# Patient Record
Sex: Female | Born: 1955 | Race: Black or African American | Hispanic: No | Marital: Single | Smoking: Never smoker
Health system: Southern US, Community
[De-identification: ages and names within clinical notes are randomized; demographics above are authoritative.]

## PROBLEM LIST (undated history)

## (undated) DIAGNOSIS — R51 Headache: Secondary | ICD-10-CM

## (undated) DIAGNOSIS — R21 Rash and other nonspecific skin eruption: Secondary | ICD-10-CM

## (undated) DIAGNOSIS — I10 Essential (primary) hypertension: Secondary | ICD-10-CM

## (undated) HISTORY — PX: HEEL SPUR SURGERY: SHX665

## (undated) HISTORY — PX: COLONOSCOPY: SHX174

## (undated) HISTORY — PX: DISTAL ULNA EXCISION: SUR514

## (undated) HISTORY — PX: WRIST SURGERY: SHX841

## (undated) HISTORY — PX: DILATION AND CURETTAGE OF UTERUS: SHX78

## (undated) HISTORY — PX: ENDOMETRIAL ABLATION: SHX621

---

## 1998-06-10 ENCOUNTER — Other Ambulatory Visit: Admission: RE | Admit: 1998-06-10 | Discharge: 1998-06-10 | Payer: Self-pay | Admitting: Obstetrics & Gynecology

## 1999-08-18 ENCOUNTER — Other Ambulatory Visit: Admission: RE | Admit: 1999-08-18 | Discharge: 1999-08-18 | Payer: Self-pay | Admitting: Obstetrics & Gynecology

## 2000-07-06 ENCOUNTER — Encounter: Admission: RE | Admit: 2000-07-06 | Discharge: 2000-07-06 | Payer: Self-pay | Admitting: Internal Medicine

## 2000-07-06 ENCOUNTER — Encounter: Payer: Self-pay | Admitting: Internal Medicine

## 2001-10-08 ENCOUNTER — Other Ambulatory Visit: Admission: RE | Admit: 2001-10-08 | Discharge: 2001-10-08 | Payer: Self-pay | Admitting: Obstetrics & Gynecology

## 2004-10-28 ENCOUNTER — Other Ambulatory Visit: Admission: RE | Admit: 2004-10-28 | Discharge: 2004-10-28 | Payer: Self-pay | Admitting: Obstetrics & Gynecology

## 2005-01-05 ENCOUNTER — Ambulatory Visit (HOSPITAL_COMMUNITY): Admission: RE | Admit: 2005-01-05 | Discharge: 2005-01-05 | Payer: Self-pay | Admitting: Obstetrics & Gynecology

## 2005-01-05 ENCOUNTER — Encounter (INDEPENDENT_AMBULATORY_CARE_PROVIDER_SITE_OTHER): Payer: Self-pay | Admitting: *Deleted

## 2005-12-28 ENCOUNTER — Other Ambulatory Visit: Admission: RE | Admit: 2005-12-28 | Discharge: 2005-12-28 | Payer: Self-pay | Admitting: Obstetrics & Gynecology

## 2010-09-09 ENCOUNTER — Ambulatory Visit
Admission: RE | Admit: 2010-09-09 | Discharge: 2010-09-09 | Payer: Self-pay | Source: Home / Self Care | Attending: Specialist | Admitting: Specialist

## 2010-09-18 HISTORY — PX: KNEE ARTHROSCOPY: SUR90

## 2010-11-28 LAB — POCT I-STAT, CHEM 8
BUN: 17 mg/dL (ref 6–23)
Calcium, Ion: 1.22 mmol/L (ref 1.12–1.32)
Chloride: 101 mEq/L (ref 96–112)
Creatinine, Ser: 0.9 mg/dL (ref 0.4–1.2)
Glucose, Bld: 119 mg/dL — ABNORMAL HIGH (ref 70–99)
HCT: 41 % (ref 36.0–46.0)
Hemoglobin: 13.9 g/dL (ref 12.0–15.0)
Potassium: 3.4 mEq/L — ABNORMAL LOW (ref 3.5–5.1)
Sodium: 141 mEq/L (ref 135–145)
TCO2: 30 mmol/L (ref 0–100)

## 2011-02-03 NOTE — Op Note (Signed)
Monique Kirby, Monique Kirby             ACCOUNT NO.:  [id number]   MEDICAL RECORD NO.:  [id number]          PATIENT TYPE:  AMB   LOCATION:  SDC                           FACILITY:  [facility]   PHYSICIAN:  Freddy Finner, M.D.   DATE OF BIRTH:  05/02/1956   DATE OF PROCEDURE:  01/05/2005  DATE OF DISCHARGE:                                 OPERATIVE REPORT   PREOPERATIVE DIAGNOSES:  1.  Uterine fibroids.  2.  A 2.6 cm endometrial mass consistent with polyp or fibroid.  3.  Thickened myometrium with degenerating myoma or adenomyosis.  4.  Known pelvic endometriosis.   POSTOPERATIVE DIAGNOSES:  1.  Uterine fibroids.  2.  A 2.6 cm endometrial mass consistent with polyp or fibroid.  3.  Thickened myometrium with degenerating myoma or adenomyosis.  4.  Known pelvic endometriosis.   OPERATIVE PROCEDURE:  Hysteroscopy D&C and NovaSure ablation of endometrial  cavity.   SURGEON:  Freddy Finner, M.D.   ANESTHESIA:  General.   INTRAOPERATIVE COMPLICATIONS:  None.   ESTIMATED INTRAOPERATIVE BLOOD LOSS:  Less than 10 mL.   LACTATED RINGER'S DEFICIT:  60 mL.   The patient is a 55 year old with longstanding known uterine leiomyomata.  She has had menorrhagia with irregular menses. She has been surgically  documented to have pelvic endometriosis. She was seen in February 2006 and a  sonohysterogram revealed a mass in the endometrial cavity. It showed a 4 mm  anterior endometrial lining. It showed thickening of the myometrium and at  least three fibroids, the largest of which was 3.4 cm. She is admitted now  for hysteroscopy D&C.   She was admitted on the morning of surgery. She was brought to the operating  room, there placed under adequate general anesthesia, placed in the dorsal  lithotomy position using the [place] stirrup system. Betadine prep was carried  out in the usual fashion, prepping the mons, perineum, and vagina. Sterile  drapes were applied. A bivalve speculum was introduced. There  was narrowing  of the vaginal apex and stenosis of the cervix which is nulliparous. The  cervix was identified, grasped with a single tooth tenaculum. Cervical  sounding was to 3 cm, uterine sounding was to 10 cm. The cervix was  progressively dilated with Hegar dilators to 23. The 12.5-degree [facility]  hysteroscope was introduced and the endometrial cavity inspected. There was  what appeared to be one small polyp on the lower segment to the patient's  left. In the fundal cavity there was an elongated mass in the posterolateral  right side of the uterine cavity. Photographs were made and retained in the  office record. Gentle, thorough curettage and exploration with the polyp  forceps did produce soft tissue-looking material, perhaps consistent with a  degenerating polyp or myoma. The NovaSure device was then applied in the  standard fashion. The cavity seemed somewhat narrow and somewhat irregular  overall. The application was safely made per the testing. Cavity width,  however, was only approximately 2.4 cm. The device was deployed and  activated. Reinspection after the NovaSure appeared to show minimal  endometrial ablation except  perhaps a little on the lower uterine segment.  It was not felt safe to repeat the procedure and it was not repeated. There  were no significant intraoperative complications of any kind. All the  instruments were removed. The patient was awakened and taken to the recovery  room in good condition. She will be discharged with routine outpatient  surgical instructions. She was given Vicodin to be taken as needed for  postoperative pain.   Power consumed was 86 watts. Time of ablation 1 minute 20 seconds.      WRN/MEDQ  D:  01/05/2005  T:  01/05/2005  Job:  [id number]

## 2011-02-08 ENCOUNTER — Other Ambulatory Visit: Payer: Self-pay | Admitting: Specialist

## 2011-02-08 ENCOUNTER — Other Ambulatory Visit (HOSPITAL_COMMUNITY): Payer: Self-pay | Admitting: Specialist

## 2011-02-08 ENCOUNTER — Ambulatory Visit (HOSPITAL_COMMUNITY)
Admission: RE | Admit: 2011-02-08 | Discharge: 2011-02-08 | Disposition: A | Discharge status: Home / Self Care | Payer: BC Managed Care – PPO | Source: Ambulatory Visit | Attending: Specialist | Admitting: Specialist

## 2011-02-08 ENCOUNTER — Encounter (HOSPITAL_COMMUNITY): Payer: BC Managed Care – PPO

## 2011-02-08 DIAGNOSIS — Z01811 Encounter for preprocedural respiratory examination: Secondary | ICD-10-CM

## 2011-02-08 DIAGNOSIS — Z01812 Encounter for preprocedural laboratory examination: Secondary | ICD-10-CM | POA: Insufficient documentation

## 2011-02-08 DIAGNOSIS — Z01818 Encounter for other preprocedural examination: Secondary | ICD-10-CM | POA: Insufficient documentation

## 2011-02-08 LAB — DIFFERENTIAL
Basophils Absolute: 0 10*3/uL (ref 0.0–0.1)
Basophils Relative: 0 % (ref 0–1)
Eosinophils Absolute: 0.1 10*3/uL (ref 0.0–0.7)
Eosinophils Relative: 1 % (ref 0–5)
Lymphocytes Relative: 43 % (ref 12–46)
Lymphs Abs: 3.2 10*3/uL (ref 0.7–4.0)
Monocytes Absolute: 0.5 10*3/uL (ref 0.1–1.0)
Monocytes Relative: 6 % (ref 3–12)
Neutro Abs: 3.6 10*3/uL (ref 1.7–7.7)
Neutrophils Relative %: 49 % (ref 43–77)

## 2011-02-08 LAB — COMPREHENSIVE METABOLIC PANEL
ALT: 14 U/L (ref 0–35)
AST: 17 U/L (ref 0–37)
Albumin: 4.7 g/dL (ref 3.5–5.2)
Alkaline Phosphatase: 66 U/L (ref 39–117)
BUN: 11 mg/dL (ref 6–23)
CO2: 31 mEq/L (ref 19–32)
Calcium: 10.2 mg/dL (ref 8.4–10.5)
Chloride: 94 mEq/L — ABNORMAL LOW (ref 96–112)
Creatinine, Ser: 0.57 mg/dL (ref 0.4–1.2)
GFR calc Af Amer: >60 mL/min (ref >60)
GFR calc non Af Amer: >60 mL/min (ref >60)
Glucose, Bld: 97 mg/dL (ref 70–99)
Potassium: 3.1 mEq/L — ABNORMAL LOW (ref 3.5–5.1)
Sodium: 136 mEq/L (ref 135–145)
Total Bilirubin: 0.4 mg/dL (ref 0.3–1.2)
Total Protein: 7.9 g/dL (ref 6.0–8.3)

## 2011-02-08 LAB — SURGICAL PCR SCREEN: MRSA, PCR: NEGATIVE

## 2011-02-08 LAB — CBC
HCT: 41.9 % (ref 36.0–46.0)
Hemoglobin: 13.7 g/dL (ref 12.0–15.0)
MCH: 27.9 pg (ref 26.0–34.0)
MCHC: 32.7 g/dL (ref 30.0–36.0)
MCV: 85.3 fL (ref 78.0–100.0)
Platelets: 321 10*3/uL (ref 150–400)
RBC: 4.91 MIL/uL (ref 3.87–5.11)
RDW: 12 % (ref 11.5–15.5)
WBC: 7.3 10*3/uL (ref 4.0–10.5)

## 2011-02-08 LAB — APTT: aPTT: 31 seconds (ref 24–37)

## 2011-02-08 LAB — URINALYSIS, ROUTINE W REFLEX MICROSCOPIC
Bilirubin Urine: NEGATIVE
Nitrite: NEGATIVE
Protein, ur: 30 mg/dL — AB
Specific Gravity, Urine: 1.019 (ref 1.005–1.030)
Urobilinogen, UA: 0.2 mg/dL (ref 0.0–1.0)

## 2011-02-08 LAB — PROTIME-INR
INR: 0.99 (ref 0.00–1.49)
Prothrombin Time: 13.3 seconds (ref 11.6–15.2)

## 2011-02-08 LAB — URINE MICROSCOPIC-ADD ON

## 2011-02-14 ENCOUNTER — Inpatient Hospital Stay (HOSPITAL_COMMUNITY)
Admission: RE | Admit: 2011-02-14 | Discharge: 2011-02-17 | DRG: 209 | Disposition: A | Discharge status: Home Health | Payer: BC Managed Care – PPO | Source: Ambulatory Visit | Attending: Specialist | Admitting: Specialist

## 2011-02-14 DIAGNOSIS — I10 Essential (primary) hypertension: Secondary | ICD-10-CM | POA: Diagnosis not present

## 2011-02-14 DIAGNOSIS — M1710 Unilateral primary osteoarthritis, unspecified knee: Principal | ICD-10-CM | POA: Diagnosis present

## 2011-02-14 DIAGNOSIS — D62 Acute posthemorrhagic anemia: Secondary | ICD-10-CM | POA: Diagnosis not present

## 2011-02-14 DIAGNOSIS — Z01812 Encounter for preprocedural laboratory examination: Secondary | ICD-10-CM

## 2011-02-14 DIAGNOSIS — E871 Hypo-osmolality and hyponatremia: Secondary | ICD-10-CM | POA: Diagnosis not present

## 2011-02-14 HISTORY — PX: JOINT REPLACEMENT: SHX530

## 2011-02-15 LAB — BASIC METABOLIC PANEL
CO2: 26 mEq/L (ref 19–32)
Calcium: 8.8 mg/dL (ref 8.4–10.5)
Creatinine, Ser: 0.6 mg/dL (ref 0.4–1.2)
GFR calc Af Amer: >60 mL/min (ref >60)
GFR calc non Af Amer: >60 mL/min (ref >60)
Sodium: 132 mEq/L — ABNORMAL LOW (ref 135–145)

## 2011-02-15 LAB — CBC
Hemoglobin: 11 g/dL — ABNORMAL LOW (ref 12.0–15.0)
MCH: 28 pg (ref 26.0–34.0)
MCHC: 32.4 g/dL (ref 30.0–36.0)
RDW: 12.5 % (ref 11.5–15.5)

## 2011-02-15 NOTE — Op Note (Signed)
NAMEJEVAEH, Monique Kirby             ACCOUNT NO.:  [id number]  MEDICAL RECORD NO.:  [id number]           PATIENT TYPE:  I  LOCATION:  1609                         FACILITY:  [facility]  PHYSICIAN:  Erasmo Leventhal, M.D.DATE OF BIRTH:  Dec 11, 1955  DATE OF PROCEDURE: DATE OF DISCHARGE:                              OPERATIVE REPORT   PREOPERATIVE DIAGNOSIS:  Right knee end-stage osteoarthritis.  POSTOPERATIVE DIAGNOSIS:  Right knee end-stage osteoarthritis.  PROCEDURE:  Right total knee arthroplasty.  SURGEON:  Erasmo Leventhal, M.D.  ASSISTANT:  Jamelle Rushing, P.A.  ANESTHESIA:  Spinal with monitored anesthesia care.  ESTIMATED BLOOD LOSS:  Loss less than 50 cc.  DRAINS:  One Hemovac.  COMPLICATIONS:  None.  TOURNIQUET TIME:  80 minutes at 250 mmHg.  DISPOSITION:  PACU stable.  OPERATIVE IMPLANTS:  DePuy [employer] Sigma size 2.5 femur, size 2.5 tibia, 10 mm posterior-stabilized rotating platform tibial insert and 35 mm all-polyethylene patella.  All cemented.  OPERATIVE DETAILS:  The patient was counseled in the holding area, the correct site was identified.  Chart was signed appropriately and taken to the operating room,  IV antibiotics were given.  Spinal anesthetic was administered.  Foley catheter was placed utilizing sterile technique by OR circulating nurse.  Right lower extremity was well padded and bumped.  The right knee was examined and she had 5 degrees of flexion contracture.  She can flex to 110 degrees.  She was adequately prepped with DuraPrep in sterile fashion.  She was exsanguinated with Esmarch and tourniquet was inflated to 250 mmHg.  Straight midline incision was made through skin and subcutaneous tissue. Medial soft tissue flaps were developed to appropriate level.  Medial parapatellar arthrotomy was performed.  Approximately, the soft tissue __________ on the tibia.  Knee was then flexed.  End-stage arthritis changes, bone  against bone was soft.  Cruciate ligaments were resected.  Starter hole made in the distal femur.  Canal was irrigated until the effluent was clear. Intramedullary rod was gently placed, size 5 degree valgus cut and 2 mm cut of  distal femur.  Distal femur was found be a size 3.  A 2.5 rotation mark was set, cutting block was 5 cut and it was cut to fit a size 2.5 femur.  Medial and lateral menisci were removed under direct visualization.  Genicular vessels were coagulated.  Posterior neurovascular structures were protected.  Tibia subluxed anteriorly padded.  Utilizing extramedullary alignment, we took a 4 mm cut off patient's side and medial size at 0 degrees slope.  Posteromedial and posterior femoral chamfers were done on direct visualization with a osteotome and mallet.  Flexion and extension blocks did not show well balanced.  Tibia was subluxed anteriorly and found be a size 2.5.  Reamer punch was performed.  Femoral box cut.  Now 2.5 femur, 2.5 tibia, 10 trial insert, excellent range of motion, soft tissue balance, varus-valgus stress.  We chose size 35 and appropriate amount of bone was resected.  Lug holes were made.  Patella tracked anatomically.  All trials removed.  The knee was irrigated with pulsatile lavage utilizing modern cement technique.  All components were cemented into place, 2.5 tibia, 2.5 femur, 10 mm posterior-stabilized rotating platform tibial insert and 35 patella. All cement had cured.  Well-balanced flexor extension gaps, varus-valgus stress.  Trial was removed.  Excess cement was removed.  The knee was irrigated.  Gelfoam was placed posteriorly and tibia subluxed anteriorly and a final 10 mm posterior stabilized rotating platform tibial insert was implanted.  At this point of time, I had a well-aligned and well- balanced tibia.  The Hemovac drain was placed.  The arthrotomy was closed at 90 degrees of flexion with Vicryl suture.  Subcu with Vicryl. Skin  with subcutaneous Monocryl suture.  Steri-Strips applied.  Drains hooked to suction.  The patient received no complications or problems. She was then awakened, and taken to the PACU in stable condition.  To help with surgical technique and decision making, Mr. Oneida Alar, PA assistance was needed throughout the entire case.          ______________________________ Erasmo Leventhal, M.D.     RAC/MEDQ  D:  02/14/2011  T:  02/15/2011  Job:  [id number]  Electronically Signed by Eugenia Mcalpine M.D. on 02/15/2011 01:03:43 PM

## 2011-02-15 NOTE — H&P (Addendum)
Monique Kirby, HAUSCHILD             ACCOUNT NO.:  [id number]  MEDICAL RECORD NO.:  [id number]  LOCATION:                                 FACILITY:  PHYSICIAN:  Erasmo Leventhal, M.D.DATE OF BIRTH:  Jun 13, 1956  DATE OF ADMISSION:  02/14/2011 DATE OF DISCHARGE:                             HISTORY & PHYSICAL   CHIEF COMPLAINT:  Painful right knee.  PRESENT ILLNESS:  The patient is a 55 year old female with chronic issues related to painful right knee.  The patient has failed conservative treatment with injections, arthroscopy, and physical therapy.  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  The patient's history is reviewed and discussed with Dr. Thomasena Edis for further management.  She would like to proceed with a total knee arthroplasty on the right.  ALLERGIES: 1. LEVAQUIN. 2. LODINE. 3. FELDENE. 4. ASPIRIN. 5. NEOMYCIN. 6. NEOSPORIN. 7. She does have a LATEX allergy. 8. She is allergic to [facility] and ZUCCHINI.  PRIMARY CARE PHYSICIAN:  Erskine Speed, M.D.  ALLERGIST:  Percival Spanish, M.D.  CURRENT MEDICATIONS: 1. Diovan/hydrochlorothiazide 160/25 mg a day. 2. Zyrtec 3. ProAir inhaler as needed. 4. Dulera inhaler 2 puffs twice a day.  PAST MEDICAL HISTORY:  Includes: 1. Hypertension. 2. Asthma. 3. History of migraines. 4. Osteoarthritis, right knee. 5. Partial upper dentures.  REVIEW OF SYSTEMS:  NEUROLOGIC:  She does have a history of migraines, mostly been premenopausal but she has been far and few between.  She denies any strokes, seizures, convulsions.  No alcohol or drug problems. PULMONARY:  She does have asthma, it is well controlled on her current medications.  She has never been hospitalized.  Denies any COPD, asbestos, sleep apnea, or tuberculosis.  CARDIOVASCULAR:  Her medicines for her blood pressure have  been stable.  She has not had a stress test. She denied any chest pains, irregular heart rhythms, chest discomfort, shortness of breath.  GI:  She denies any reflux issues, ulcers, gastritis, hepatitis, gallbladder, irritable bowel or colon issue.  GU: She denies any frequent urinary tract infection, cystitis, kidney stones.  ENDOCRINE.  She denies any thyroid or diabetic issues. Hematologic.  She has never had any anemia in and around her periods. She denies any blood clots.  No cancers.  PAST SURGICAL HISTORY:  Includes: 1. Right knee arthroscopy. 2. Benign tumor removal. 3. Right forearm surgery. 4. Right and left wrist surgery without any anesthetic complications.  FAMILY MEDICAL HISTORY:  Father is deceased from scleroderma at age of 17.  Mother is alive at the age of 35 with blood pressure and arthritis.  SOCIAL HISTORY:  She is single.  She does not use alcohol or drugs.  She lives alone in a two-storey home.  PHYSICAL EXAM:  VITAL SIGNS:  Height is 5 feet 4 inches, weight is 138, blood pressure is 138/88, pulse of 80 and regular, respirations are 14 and nonlabored.  She is afebrile. GENERAL:  This is a healthy-appearing well-developed female, conscious, alert, appropriate, appears to be a good historian, appears to be in no distress.  Ambulates with a cane in  her left hand. HEENT:  Head was normocephalic.  Gross hearing is intact. NECK:  Supple.  No palpable lymphadenopathy.  Good range of motion. CHEST:  Lung sounds clear and equal. HEART:  Regular rate and rhythm. ABDOMEN:  Soft, nontender. EXTREMITIES:  Upper extremities had excellent range of motion.  Good motor strength.  Lower extremities, the patient had good range of motion of both hips.  Right knee she had full extension, painful flexion back to 120.  She was sore throughout.  She had no ligament instability. Calves bilaterally were soft and nontender.  No signs of phlebitis or edema.  She had good range of  motion of her left hip and knee without any discomfort. Breast, rectal and GU exams were deferred. Peripheral vascular, carotid pulses were 2+, no bruits.  Radial pulses 2+, posterior tibial pulses were 2+.  IMPRESSION: 1. Advanced osteoarthritic changes, right knee. 2. Hypertension. 3. Asthma. 4. History of migraines.  PLAN:  The patient will undergo all routine labs and tests prior to having a right total knee arthroplasty by Dr. Thomasena Edis at [facility] on Feb 14, 2011.  All the patient's questions were encouraged and answered.     Jamelle Rushing, P.A.   ______________________________ Erasmo Leventhal, M.D.    RWK/MEDQ  D:  02/08/2011  T:  02/08/2011  Job:  [id number]  Electronically Signed by Arlyn Leak P.A. on 03/03/2011 07:31:21 AM Electronically Signed by Eugenia Mcalpine M.D. on 04/05/2011 04:49:56 PM

## 2011-02-16 LAB — BASIC METABOLIC PANEL
CO2: 32 mEq/L (ref 19–32)
Calcium: 9.7 mg/dL (ref 8.4–10.5)
Chloride: 89 mEq/L — ABNORMAL LOW (ref 96–112)
Creatinine, Ser: 0.61 mg/dL (ref 0.4–1.2)
GFR calc Af Amer: >60 mL/min (ref >60)
Sodium: 128 mEq/L — ABNORMAL LOW (ref 135–145)

## 2011-02-16 LAB — CBC
Hemoglobin: 12.3 g/dL (ref 12.0–15.0)
MCH: 28.5 pg (ref 26.0–34.0)
MCHC: 33.5 g/dL (ref 30.0–36.0)
Platelets: 278 10*3/uL (ref 150–400)
RBC: 4.31 MIL/uL (ref 3.87–5.11)

## 2011-02-16 LAB — TSH: TSH: 2.08 u[IU]/mL (ref 0.350–4.500)

## 2011-02-17 LAB — CROSSMATCH
ABO/RH(D): A POS
Antibody Screen: NEGATIVE
Unit division: 0
Unit division: 0

## 2011-02-17 LAB — CBC
MCHC: 33.1 g/dL (ref 30.0–36.0)
Platelets: 301 10*3/uL (ref 150–400)
RDW: 12.5 % (ref 11.5–15.5)
WBC: 8.3 10*3/uL (ref 4.0–10.5)

## 2011-02-17 LAB — BASIC METABOLIC PANEL
Calcium: 9.3 mg/dL (ref 8.4–10.5)
GFR calc Af Amer: >60 mL/min (ref >60)
GFR calc non Af Amer: >60 mL/min (ref >60)
Potassium: 3.9 mEq/L (ref 3.5–5.1)
Sodium: 126 mEq/L — ABNORMAL LOW (ref 135–145)

## 2011-02-17 LAB — OSMOLALITY: Osmolality: 260 mOsm/kg — ABNORMAL LOW (ref 275–300)

## 2011-02-18 LAB — CREATININE, URINE, RANDOM: Creatinine, Urine: 76.9 mg/dL

## 2011-04-05 NOTE — Discharge Summary (Signed)
NAMEJACLYNN, Monique Kirby             ACCOUNT NO.:  [id number]  MEDICAL RECORD NO.:  [id number]  LOCATION:  1609                         FACILITY:  [facility]  PHYSICIAN:  Erasmo Leventhal, M.D.DATE OF BIRTH:  Dec 31, 1955  DATE OF ADMISSION:  02/14/2011 DATE OF DISCHARGE:  02/17/2011                              DISCHARGE SUMMARY   ADMISSION DIAGNOSES: 1. End-stage osteoarthritis right knee. 2. Hypertension. 3. Asthma. 4. Migraines.  DISCHARGE DIAGNOSES: 1. Right total knee arthroplasty without any complications. 2. Postoperative hyponatremia felt related to surgical dehydration and     hydrochlorothiazide. 3. She has uncontrolled postoperative hypertension. 4. History of asthma. 5. History of migraines.  HISTORY OF PRESENT ILLNESS:  The patient is a 55 year old female well known to Dr. Thomasena Kirby for evaluation and treatment of problems related to her right knee.  The patient's evaluation found she had significant arthritis of her right knee.  She failed conservative treatments which included medications and injections.  She has also failed arthroscopic evaluation and debridement.  The patient's pain and difficulty of motion with right knee continued to progress.  The patient elected proceed with a total knee arthroplasty.  PRIMARY CARE PHYSICIAN:  Dr. Nila Kirby.  ALLERGIST:  Dr. Hyde Kirby.  SURGICAL PROCEDURES:  On February 17, 2011, the patient was taken to the OR by Dr. Valma Cava, assisted by Arlyn Leak PA-C, under spinal and MAC anesthesia underwent a right total knee arthroplasty with a DePuy rotating platform Sigma system.  The patient tolerated the procedure well, had no complications.  Minimal blood loss return, no tourniquet used, one Hemovac drain left in place.  The patient was transferred to recovery room and then to the orthopedic floor in good condition to follow a total knee arthroplasty protocol.  The patient had the following components implanted:  A size  2.5 right Sigma femoral component, a size 2.5 keeled tibial tray, a size 2.5 10-mm thickness polyethylene bearing, a size 35 3 peg patella.  All components  were implanted with polymethyl methacrylate.  CONSULTS:  A medical consult was requested for the patient's postoperative hyponatremia natremia, Physical Therapy and, Case Management.  HOSPITAL COURSE:  On Feb 14, 2011, the patient was admitted to [facility] under the care Dr. Valma Cava.  The patient was taken to the OR where a right total knee arthroplasty was performed without any complications.  The patient was transferred to recovery room and then to the orthopedic floor in good condition, followed total knee protocol with IV antibiotics, pain medicines and Lovenox for DVT prophylaxis and one Hemovac drain left in place.  The patient did have some pain issues the first initial postop night, so she was placed on a PCA.  The patient had no other issues.  Vital signs remained stable. First initial postop day, she was a little bit of hypertensive.  Her sodium did drop to 132, potassium 4.3, hemoglobin 11.  The patient was able to wean off her IV medications and her p.o. medications were adjusted and she became more comfortable.  Hemovac drain was discontinued intact.  The patient's blood pressures throughout the day raised from 170s to the 200s, so a medical consult was requested for evaluation and management  of her hypertension and her hyponatremia.  On postop day #2, her hemoglobin was 12.3.  Her sodium was 128 with a potassium of 3.7 but her blood pressure was improving on medications which were adjusted by Medicine.  Her blood pressure was 167/76.  She was placed on restricted IV fluids and restriction of free water.  The hospitalist service still continued to evaluate and manage her medications and her hyponatremia and hypertension.  The patient's dressing was taken down her, her wound was benign for any signs  of infection, her leg was neuromotor vascularly intact.  She was able to participate with physical therapy and use the CPM.  Postop day #3, the patient's blood pressure was doing much better.  She was 122/71.  Her sodium was 126, potassium of 3.9.  The hospitalist service continued to evaluate the patient with evaluating her serum osmolarity, thyroid.  They changed her medications around down to Norvasc and Benicar and she had her hydrochlorothiazide DC'ed the day previous and recommended continue to hold it until she follows up with her primary care physician.  It was felt on postop day #3 from the medical standpoint that her sodium level of 126, she was asymptomatic.  She felt that this would continue to improve as an outpatient as a result of her hydrochlorothiazide and she did not need to be maintained in the hospital but just recommended follow up with her primary care physician.  Her blood pressure was 122/71 on date of discharge.  From the orthopedic standpoint, the patient was medically and orthopedically ready for discharge home.  So she was discharged home in good condition to follow up in 2 weeks with Dr. Thomasena Kirby for her total knee arthroplasty and in 2 weeks with her primary care physician for evaluation of her blood pressure and her sodium level and recommendations of further medication adjustments.  LABORATORY DATA:  CBC on discharge found a white blood cell count at 8.3, hemoglobin 11.9, hematocrit 34.1, platelets of 301.  Estimated GFR was greater than 60.  Urine osmolarity was 223.  TSH was 2.080.  Urine creatinine was 76.9.  Urine sodium was less than 15.  On discharge, her sodium was 126, potassium was 3.9, glucose 126, BUN 14, creatinine 0.86.  DISCHARGE INSTRUCTIONS:  DIET:  No restrictions.  WOUND CARE:  The patient is keep her wound clean and dry.  She is to change her dressing on a daily basis.  ACTIVITY:  She is to increase her activity slowly with the use  of a walker and instructions by physical therapy.  MEDICATIONS: 1. The patient is to hold her hydrocodone while she is using her     postoperative Dilaudid. 2. Dilaudid 2 mg tablets one or two tablets every 4 to 6 hours for     pain if needed. 3. Robaxin 500 mg 1 tablet every 6 hours for muscle spasms if needed. 4. Lovenox 30 mg injection once every 12 hours for a total of 14 days. 5. Tart Cherry OTC tablets 1 or 2 tablets a day. 6. Zyrtec 10 mg a day. 7. Albuterol inhaler 2 puffs a day as needed. 8. Dulera 2 puffs daily. 9. Diovan HCT, on hold. 10.Amlodipine 5 mg with once a day. 11.Benicar 40 mg once a day.  FOLLOWUP:  She has 2-week followup appointment with Dr. Thomasena Kirby for wound check and physical therapy progression.  The patient is call [phone] for that followup appointment.  Follow up with Dr. Nila Kirby in 2 weeks for further  evaluation of her postoperative hypertension, hyponatremia for medication adjustments.  CONDITION ON DISCHARGE:  The patient's condition upon discharge to home is listed as improving good.  To note no for the transcriptionist please go up to her discharge diagnoses she has uncontrolled postoperative hypertension as in the third  DIAGNOSIS:  .  Thank you, please carbon copy Dr. Valma Cava and Dr. Nila Kirby thank you     Jamelle Rushing, P.A.   ______________________________ Erasmo Leventhal, M.D.    RWK/MEDQ  D:  03/01/2011  T:  03/01/2011  Job:  [id number]  cc:   Erasmo Leventhal, M.D. Fax: [phone]  Erskine Speed, M.D. Fax: [phone]  Electronically Signed by Arlyn Leak P.A. on 03/03/2011 07:31:14 AM Electronically Signed by Eugenia Mcalpine M.D. on 04/05/2011 04:49:52 PM

## 2011-04-14 NOTE — Consult Note (Signed)
Monique Kirby, Monique Kirby             ACCOUNT NO.:  [id number]  MEDICAL RECORD NO.:  [id number]           PATIENT TYPE:  I  LOCATION:  1609                         FACILITY:  [facility]  PHYSICIAN:  Baltazar Najjar, MD     DATE OF BIRTH:  10/06/1955  DATE OF CONSULTATION: DATE OF DISCHARGE:                                CONSULTATION   CONSULTING PHYSICIAN:  Dr. Eugenia Mcalpine.  REASON FOR CONSULTATION:  Management of uncontrolled hypertension.  HISTORY OF PRESENT ILLNESS:  Monique Kirby is a 55 year old very pleasant African American woman with history of hypertension, was admitted to the hospital with chief complaint of right knee pain.  She underwent right total knee arthroplasty for right knee end-stage osteoarthritis.  The patient was noted to have uncontrolled hypertension with systolic blood pressure running between 180s to 200s and I was called to see her in consultation for management of her blood pressure.  The patient denies any headache, dizziness, chest pain, or shortness of breath.  She is complaining of significant right knee pain, which she rated 7/10 to 9/10.  Denies any motor weakness, any numbness or other complaints.  No blurring of vision or headache.  PAST MEDICAL HISTORY: 1. End-stage osteoarthritis of the right knee, status post right total     knee arthroplasty. 2. History of hypertension. 3. History of asthma. 4. History of migraines.  PAST SURGICAL HISTORY: 1. Right knee arthroscopy. 2. Right knee arthroplasty. 3. Benign tumor removal. 4. Right forearm surgery. 5. Right and left wrist surgery.  FAMILY HISTORY:  Significant for hypertension in both parent's sides and arthritis.  SOCIAL HISTORY:  She is single, lives alone.  Denies smoking, EtOH drinking, or illicit drug use.  ALLERGIES:  She is allergic to, 1. ASPIRIN. 2. PIROXICAM. 3. LEVAQUIN. 4. ETODOLAC. 5. LATEX.  CURRENT INPATIENT MEDICATIONS: 1. Colace 100 mg b.i.d. 2. Lovenox 30 mg  subcutaneously q.12 h. 3. Ferrous sulfate 325 mg t.i.d. 4. Hydrochlorothiazide 25 mg daily. 5. Dilaudid 7.5 mg IV q.4 h. p.r.n. 6. Benicar 20 mg daily. 7. Senokot 1 tablet p.o. b.i.d. 8. Normal saline 0.9% at 50 cc/hour. 9. Tylenol 325 mg to 650 mg p.o. q.4 h. p.r.n. 10.Albuterol p.r.n. 11.Dulcolax p.r.n. 12.Benadryl p.r.n. 13.Claritin 10 mg daily p.r.n. 14.Maalox 30 mL q.4 h. p.r.n. 15.Robaxin 500 mg p.o. q.6 h. p.r.n. and 500 mg IV q.6 h. p.r.n. 16.Reglan 5 mg to 10 mg q.8 h. p.r.n. 17.Lopressor 5 mg IV q.6 h. p.r.n. 18.Zofran 4 mg IV q.6 h p.r.n. 19.Prochlorperazine 5 mg to 10 mg IV q.6 h. p.r.n. 20.Ambien 5 mg q.h.s. p.r.n.  REVIEW OF SYSTEMS:  CHEST:  No cough, no shortness of breath. CARDIOVASCULAR:  No chest pain or palpitations.  ABDOMEN:  No abdominal pain.  No nausea, no vomiting, no change in bowel habits. CONSTITUTIONAL:  No fever or chills.  NEUROLOGIC:  No headache, no blurring of vision, no dizziness.  No motor weakness or numbness. MUSCULOSKELETAL:  Right knee pain as above.  PHYSICAL EXAMINATION:  VITAL SIGNS:  Systolic blood pressure runs between 180s to 208, heart rate in the 90s, temperature 98.9, and O2 saturation 97% on room air.  GENERAL:  She is alert in moderate distress because of pain. CHEST:  Clear to auscultation bilaterally. CARDIOVASCULAR:  S1 and S2.  Regular rhythm and rate. ABDOMEN:  Obese, soft, and nontender. EXTREMITIES:  Right knee dressing and traction.  Left knee showed no edema.  LABORATORY DATA:  Sodium 132, potassium 4.3, BUN 9, creatinine 0.6, and calcium 8.8.  Hemoglobin 11, hematocrit 34, and platelet count 277.  RADIOLOGY:  Chest x-ray showed no acute chest findings.  ASSESSMENT: 1. Accelerated hypertension. 2. Status post right knee arthroplasty. 3. Right knee pain secondary to #2. 4. Remote history of asthma.  PLAN: 1. Worsening of her blood pressure is most likely secondary to her     pain.  I will recommend better  pain control, which should be     deferred to the orthopedic service.  I recommended to place her on     labetalol 5 mg IV q.6 h. p.r.n. for systolic blood pressure more     than 160 and pulse rate more than 60. 2. I will increase her Benicar to 40 mg daily and I will discontinue     hydrochlorothiazide, given her hyponatremia and start her on     Norvasc 5 mg p.o. daily. 3. Continue to monitor her blood pressure and adjust as needed for     optimal control.  As per the patient, her blood pressure is usually     well controlled at home with systolic range runs usually in the     130s.  I think her significant pain is contributing to worsening     blood pressure. 4. Mild hyponatremia possibly secondary to hydrochlorothiazide, which     I will discontinue. 5. Mild anemia.  The patient is currently on ferrous sulfate and to     continue.  Acute drop probably secondary to blood loss during     surgery. 6. DVT prophylaxis.  The patient is currently on Lovenox.  Thank you for the consult.  We will follow along.          ______________________________ Baltazar Najjar, MD     SA/MEDQ  D:  02/15/2011  T:  02/15/2011  Job:  [id number]  Electronically Signed by Hannah Beat MD on 04/14/2011 03:12:38 PM

## 2011-09-13 ENCOUNTER — Encounter (HOSPITAL_COMMUNITY): Payer: Self-pay

## 2011-09-20 NOTE — H&P (Addendum)
Monique Kirby 09/20/2011 9:12 AM Location: [facility] Patient #: [id number] DOB: [birth year] Single / Language: [place] / Race: Black or African American Female   History of Present Illness(Monique Dierdre Highman, PA-C; 09/20/2011 12:57 PM) The patient is a 56 year old female who comes in today for a preoperative History and Physical. The patient is scheduled for a right L3-4 disectomy (for right L3-4 disc herniation) to be performed by Dr. Debria Garret D. Shon Baton, MD at [facility] on Thursday, September 28, 2011 at 1:00PM .    Family History(Monique J [facility], PA-C; 09/20/2011 10:30 AM) Hypertension. mother and sister Osteoarthritis. mother and sister   Social History(Monique J [facility], PA-C; 09/20/2011 10:30 AM) Alcohol use. never consumed alcohol Children. 0 Current work status. working full time Drug/Alcohol Rehab (Currently). no Drug/Alcohol Rehab (Previously). no Exercise. Exercises weekly; does running / walking and other Illicit drug use. no Living situation. live alone Marital status. single Number of flights of stairs before winded. greater than 5 Pain Contract. no Tobacco / smoke exposure. no Tobacco use. never smoker   Medication History(Monique J KOVACH, PA-C; 09/20/2011 10:33 AM) Diovan HCT (160-25MG  Tablet, 1 Oral daily) Active. Gabapentin (300MG  Capsule, 1 Oral TID) Active. Amitriptyline HCl (25MG  Tablet, 1 Oral qhs) Active. Robaxin (500MG  Tablet, 1 Oral q 6 hours) Active. (patient takes this prn) Dilaudid (2MG  Tablet, 1 Oral 4-6 hours) Active. (patient takes this prn)   Pregnancy / Birth History(Monique J KOVACH, PA-C; 09/20/2011 10:30 AM) Pregnant. no   Past Surgical History(Monique J [facility], PA-C; 09/20/2011 10:30 AM) Arthroscopy of Knee. right Colon Polyp Removal - Colonoscopy Dilation and Curettage of Uterus Foot Surgery. right bilateral Other Orthopaedic Surgery Straighten Nasal Septum Tonsillectomy Total Knee Replacement.  right   Other Problems(Monique J KOVACH, PA-C; 09/20/2011 10:30 AM) Asthma High blood pressure Migraine Headache Osteoarthritis   Review of Systems(Monique J KOVACH, PA-C; 09/20/2011 10:30 AM) General:Present- Night Sweats. Not Present- Chills, Fever, Appetite Loss, Fatigue, Feeling sick, Weight Gain and Weight Loss. Skin:Not Present- Itching, Rash, Skin Color Changes, Ulcer, Psoriasis and Change in Hair or Nails. HEENT:Not Present- Sensitivity to light, Hearing problems, Nose Bleed and Ringing in the Ears. Neck:Not Present- Swollen Glands and Neck Mass. Respiratory:Not Present- Snoring, Chronic Cough, Bloody sputum and Dyspnea. Cardiovascular:Not Present- Shortness of Breath, Chest Pain, Swelling of Extremities, Leg Cramps and Palpitations. Gastrointestinal:Not Present- Bloody Stool, Heartburn, Abdominal Pain, Vomiting, Nausea and Incontinence of Stool. Female Genitourinary:Not Present- Blood in Urine, Menstrual Irregularities, Frequency, Incontinence and Nocturia. Musculoskeletal:Present- Joint Stiffness, Joint Pain and Back Pain. Not Present- Muscle Weakness, Muscle Pain and Joint Swelling. Neurological:Present- Tingling, Numbness and Burning. Not Present- Tremor, Headaches and Dizziness. Psychiatric:Not Present- Anxiety, Depression and Memory Loss. Endocrine:Not Present- Cold Intolerance, Heat Intolerance, Excessive hunger and Excessive Thirst. Hematology:Not Present- Abnormal Bleeding, Anemia, Blood Clots and Easy Bruising.   Vitals(Sharon Gillian Shields; 09/20/2011 10:18 AM) 09/20/2011 10:16 AM Weight: 135.13 lb Height: 64 in Body Surface Area: 1.66 m Body Mass Index: 23.19 kg/m BP: 123/99 (Sitting, Left Arm, Standard)    Physical Exam(Monique J KOVACH, PA-C; 09/20/2011 11:13 AM) The physical exam findings are as follows:  Note: patient reports continued right knee pain since her knee replacement back in May. No true hip or ankle pain on the right. No true hip,  knee or ankle pain on the left.   General General Appearance- pleasant. Not in acute distress. Orientation- Oriented X3. Build & Nutrition- Well nourished and Well developed. Posture- Normal posture. Gait- Normal. Mental Status- Alert.   Integumentary Lumbar Spine-  Skin examination of the lumbar spine is without deformity, skin lesions, lacerations or abrasions.   Head and Neck Neck Global Assessment- supple. no lymphadenopathy and no nucchal rigidty.   Eye Pupil- Bilateral- Normal, Direct reaction to light normal, Equal and Regular. Motion- Bilateral- EOMI.   Chest and Lung Exam Auscultation: Breath sounds:- Clear.   Cardiovascular Auscultation:Rhythm- Regular rate and rhythm. Heart Sounds- Normal heart sounds.   Abdomen Palpation/Percussion:Palpation and Percussion of the abdomen reveal - Non Tender, No Rebound tenderness and Soft.   Peripheral Vascular Lower Extremity:Inspection- Bilateral- Inspection Normal (healed right total knee incision). Palpation:Posterior tibial pulse- Bilateral- 2+. Dorsalis pedis pulse- Bilateral- 2+.   Neurologic Sensation:Lower Extremity- Left- sensation is intact to light touch in the lower extremity. Right- sensation is diminished to light touch in the lower extremity (L4 distribution). Reflexes:Patellar Reflex- Left- 1+. Right- 0. Note: history of right total knee this past May Achilles Reflex- Bilateral- 1+. Babinski- Bilateral- Babinski not present. Clonus- Bilateral- clonus not present. Hoffman's Sign- Bilateral- Hoffman's sign not present. Testing:Seated Straight Leg Raise- Bilateral- Seated straight leg raise negative.   Musculoskeletal Spine/Ribs/Pelvis Lumbosacral Spine:Inspection and Palpation- Tenderness- no tenderness to palpation. bony and soft tissue palpation of the lumbar spine and SI joint does not recreate their typical pain. Strength and Tone: Strength:Hip  Flexion- Bilateral- 5/5. Knee Extension- Bilateral- 5/5. Knee Flexion- Bilateral- 5/5. Ankle Dorsiflexion- Bilateral- 5/5. Ankle Plantarflexion- Bilateral- 5/5. Heel walk- Bilateral- able to heel walk without difficulty. Toe Walk- Bilateral- able to walk on toes without difficulty. Heel-Toe Walk- Bilateral- able to heel-toe walk without difficulty. ROM- Flexion- full range of motion and painful. Extension- full range of motion and painful. Pain:- extension is more painful than flexion. Waddell's Signs- no Waddell's signs present. Gait and Station:Assistive Devices- cane.   Assessment & Plan(Monique J [facility], PA-C; 09/20/2011 10:45 AM) Note: unfortunately conservative measures consisting of observation, activity modification and oral pain medications have failed to alleviate her symptoms and because of the ongoing nature of her pain and the decrease in her quality of life, she wishes to proceed with surgery. Risk/benefits/alternatives to surgery/expectations following surgery have been reviewed with the patient by Dr. Shon Baton.  MRI of the lumbar spine dated 07/14/11 demonstrates L3-4: disc bulge with a superiumposed large right posterolateral disc extrusion which encroaches on the L4 root. Mild right lateral recess stenosis.  She has been medically cleared by her PCP Dr. Nila Nephew. She has not yet completed her pre-op hospital requirements and is scheduled to do so on Monday, September 25, 2011 at 11:00AM. She has not yet been fitted for her lumbar corset brace and I have informed her that our physical therapy department will likely be contacting her to get her fitted.   All of her questions were encouraged, addressed and answered. Plan, at this time, is to proceed with surgery as scheduled.   Signed electronically by Gwinda Maine, PA-C (09/20/2011 12:57 PM)   Marti Sleigh R. Howes 08/23/2011 8:16 AM Location: [facility] Patient #: [id number] DOB: [birth year] Single /  Language: [place] / Race: Black or African American Female   History of Present Illness(Kim A Ambrosio; 08/23/2011 8:22 AM) The patient is a 56 year old female who presents with back pain. The patient reports low back symptoms including pain which began 4 month(s) ago without any known injury. and Symptoms include pain and numbness (rt foot and rt leg). The pain radiates to the left thigh, right buttock, right thigh, right lower leg and right foot. The patient describes the pain  as dull and aching. The patient feels as if the symptoms are worsening. Symptoms are exacerbated by standing, sitting, lifting and bending. Current treatment includes muscle relaxants (robaxin, Dilaudid and Gabapentin). Prior to being seen today the patient was previously evaluated by a colleague (DR Ciro Backer). Past evaluation has included x-ray of the lumbar spine and MRI of the lumbar spine.    Subjective Transcription(Ariza Evans Sheela Stack, MD; 08/29/2011 8:26 AM)  Consultation requested by Dr. Ethelene Hal my partner for evaluation of right leg pain.  Kendrea Million is a very pleasant female who started having significant right leg pain after a total knee replacement in May. She has been in physical therapy postoperatively for the knee and ultimately continued to have a burning pain down the right leg and some numbness. Ultimately she had an MRI done and that demonstrated a L3-4 right disk herniation also with black disk disease and moderate degenerative disk disease at that level. As a result, the patient is referred to me.    Allergies(Kim A Ambrosio; 08/23/2011 8:16 AM) LODINE. 10/21/2009 LEVAQUIN. 10/21/2009 LATEX ALLERGY. 08/11/2003 BAND-AIDS. 08/11/2003 Aspirin. 08/28/1997 Neosporin. 08/28/1997 Feldene. 08/28/1997   Social History(Kim A Ambrosio; 08/23/2011 8:16 AM) Non smoker / no tobacco use Non drinker / no alcohol Use   Medication History(Kim A Ambrosio; 08/23/2011 8:16 AM) Amitriptyline HCl  (25MG  Tablet, 1 Oral po qhs, Taken starting 08/21/2011) Active. (rac/kaa) Amoxicillin (500MG  Tablet, 4 Oral 1-2 hrs prior to dental procedure, Taken starting 08/04/2011) Active. Dilaudid (2MG  Tablet, 1 Oral every six hours, as needed, Taken starting 07/20/2011) Active. (lmw001 (03/08/2011) - rx given to patient kwm) Robaxin (500MG  Tablet, 1 tab po q6hr prn spasms, Taken starting 04/03/2011) Active. Diovan HCT (160-25MG  Tablet, Oral) Active.   Vitals(Kim A Ambrosio; 08/23/2011 8:16 AM) 08/23/2011 8:16 AM Weight: 135.13 lb Height: 64 in Body Surface Area: 1.66 m Body Mass Index: 23.19 kg/m    Objective Transcription(Bernice Mcauliffe D Jkayla Spiewak, MD; 08/29/2011 8:26 AM)  Respiratory, no shortness of breath or chest pain.  Abdomen, soft and nontender.  On examination, she has no focal motor deficits in the lower extremities. She has diminished sensation in the L4 distribution on the right. Reflexes are decreased at the patella on the right but this may also be due to the incision of the total knee replacement. Compartments are soft and nontender. She has a positive femoral stretch test. Negative straight leg raise test.    Assessment & Plan(Kathryn G Johnson; 08/23/2011 8:44 AM) Unspecified Diagnosis Current Plans l Follow up as needed    Assessments Transcription(Daysia Vandenboom D Baileigh Modisette, MD; 08/29/2011 8:26 AM)  At this point in time, we had a long discussion about surgical intervention versus nonoperative treatment. The patient has had no formalized physical therapy for her back. At this point, I agere with Dr. Ethelene Hal. I do not think injection therapy will be very beneficial given the size of the disk herniation. I have discussed the risks of a diskectomy which include infection, bleeding, nerve damage, death, stroke, paralysis, failure to heal and need for further surgery, ongoing or worse pain, recurrent disk herniation, need for fusion surgery in the future, leak of spinal fluid. All of  the patient's questions were addressed. I have given her some literature to review.    Plans Transcription(Novella Abraha Sheela Stack, MD; 08/29/2011 8:26 AM)  We will plan for the patient to review the literature and contact and let me know if she would like to proceed with surgery or with nonoperative treatment. If she elects to go with nonoperative treatment,  I will start formalized physical therapy and see the patient again in January and reevaluate her. If she is improving, then we will continue with the nonoperative treatment. If not, then we will abandon this and proceed with surgery.  With respect to work, I do think that the patient will probably be on ongoing restrictions for at least another four months while we treat the disk herniation. The patient will drop off the necessary paperwork so that we can amend her current impairment disability information from her knee replacement.    Miscellaneous Transcription(Braison Snoke Sheela Stack, MD; 08/29/2011 8:26 AM)  Debria Garret D. Shon Baton, MD/kro  T: 08/25/2011  D: 08/23/2011    Signed electronically by Alvy Beal, MD (08/23/2011 2:15 PM)  Radicular right leg pain L3/4 HNP  No change in clinical exam Plan on discectomy L3/4

## 2011-09-25 ENCOUNTER — Encounter (HOSPITAL_COMMUNITY): Payer: Self-pay

## 2011-09-25 ENCOUNTER — Other Ambulatory Visit: Payer: Self-pay

## 2011-09-25 ENCOUNTER — Encounter (HOSPITAL_COMMUNITY)
Admission: RE | Admit: 2011-09-25 | Discharge: 2011-09-25 | Disposition: A | Discharge status: Home / Self Care | Payer: BC Managed Care – PPO | Source: Ambulatory Visit | Attending: Physician Assistant | Admitting: Physician Assistant

## 2011-09-25 ENCOUNTER — Encounter (HOSPITAL_COMMUNITY)
Admission: RE | Admit: 2011-09-25 | Discharge: 2011-09-25 | Disposition: A | Discharge status: Home / Self Care | Payer: BC Managed Care – PPO | Source: Ambulatory Visit | Attending: Orthopedic Surgery | Admitting: Orthopedic Surgery

## 2011-09-25 HISTORY — DX: Headache: R51

## 2011-09-25 HISTORY — DX: Essential (primary) hypertension: I10

## 2011-09-25 LAB — BASIC METABOLIC PANEL
Calcium: 10.7 mg/dL — ABNORMAL HIGH (ref 8.4–10.5)
GFR calc Af Amer: >90 mL/min (ref >90)
GFR calc non Af Amer: >90 mL/min (ref >90)
Sodium: 140 mEq/L (ref 135–145)

## 2011-09-25 LAB — CBC
MCH: 29 pg (ref 26.0–34.0)
Platelets: 342 10*3/uL (ref 150–400)
RBC: 4.73 MIL/uL (ref 3.87–5.11)
WBC: 7.6 10*3/uL (ref 4.0–10.5)

## 2011-09-25 LAB — SURGICAL PCR SCREEN
MRSA, PCR: NEGATIVE
Staphylococcus aureus: NEGATIVE

## 2011-09-25 NOTE — Pre-Procedure Instructions (Signed)
20 Monique Kirby  09/25/2011   Your procedure is scheduled on:  Sep 28, 2011  Report to [facility] at 1100 AM.  Call this number if you have problems the morning of surgery: [phone]   Remember:   Do not eat food:After Midnight.  May have clear liquids: up to 4 Hours before arrival.  Clear liquids include soda, tea, black coffee, apple or grape juice, broth.  Take these medicines the morning of surgery with A SIP OF WATER: inhaler, pain pill   Do not wear jewelry, make-up or nail polish.  Do not wear lotions, powders, or perfumes. You may wear deodorant.  Do not shave 48 hours prior to surgery.  Do not bring valuables to the hospital.  Contacts, dentures or bridgework may not be worn into surgery.  Leave suitcase in the car. After surgery it may be brought to your room.  For patients admitted to the hospital, checkout time is 11:00 AM the day of discharge.   Patients discharged the day of surgery will not be allowed to drive home.  Name and phone number of your driver: NA  Special Instructions: Incentive Spirometry - Practice and bring it with you on the day of surgery. and CHG Shower Use Special Wash: 1/2 bottle night before surgery and 1/2 bottle morning of surgery.   Please read over the following fact sheets that you were given: Pain Booklet and Surgical Site Infection Prevention

## 2011-09-27 MED ORDER — CEFAZOLIN SODIUM-DEXTROSE 2-3 GM-% IV SOLR
2.0000 g | INTRAVENOUS | Status: AC
  Administered 2011-09-28: 2 g via INTRAVENOUS
  Filled 2011-09-27: qty 50

## 2011-09-27 MED ORDER — LACTATED RINGERS IV SOLN
INTRAVENOUS | Status: DC
  Administered 2011-09-28: 12:00:00 via INTRAVENOUS

## 2011-09-28 ENCOUNTER — Encounter (HOSPITAL_COMMUNITY): Payer: Self-pay | Admitting: Anesthesiology

## 2011-09-28 ENCOUNTER — Encounter (HOSPITAL_COMMUNITY)
Admission: RE | Disposition: A | Discharge status: Home / Self Care | Payer: Self-pay | Source: Ambulatory Visit | Attending: Orthopedic Surgery

## 2011-09-28 ENCOUNTER — Ambulatory Visit (HOSPITAL_COMMUNITY): Payer: BC Managed Care – PPO

## 2011-09-28 ENCOUNTER — Ambulatory Visit (HOSPITAL_COMMUNITY): Payer: BC Managed Care – PPO | Admitting: Anesthesiology

## 2011-09-28 ENCOUNTER — Encounter (HOSPITAL_COMMUNITY): Payer: Self-pay | Admitting: *Deleted

## 2011-09-28 ENCOUNTER — Ambulatory Visit (HOSPITAL_COMMUNITY)
Admission: RE | Admit: 2011-09-28 | Discharge: 2011-09-29 | Disposition: A | Discharge status: Home / Self Care | Payer: BC Managed Care – PPO | Source: Ambulatory Visit | Attending: Orthopedic Surgery | Admitting: Orthopedic Surgery

## 2011-09-28 DIAGNOSIS — Z01811 Encounter for preprocedural respiratory examination: Secondary | ICD-10-CM | POA: Insufficient documentation

## 2011-09-28 DIAGNOSIS — Z01810 Encounter for preprocedural cardiovascular examination: Secondary | ICD-10-CM | POA: Insufficient documentation

## 2011-09-28 DIAGNOSIS — M5126 Other intervertebral disc displacement, lumbar region: Secondary | ICD-10-CM | POA: Insufficient documentation

## 2011-09-28 DIAGNOSIS — Z01812 Encounter for preprocedural laboratory examination: Secondary | ICD-10-CM | POA: Insufficient documentation

## 2011-09-28 DIAGNOSIS — J45909 Unspecified asthma, uncomplicated: Secondary | ICD-10-CM | POA: Insufficient documentation

## 2011-09-28 DIAGNOSIS — I10 Essential (primary) hypertension: Secondary | ICD-10-CM | POA: Insufficient documentation

## 2011-09-28 DIAGNOSIS — Z01818 Encounter for other preprocedural examination: Secondary | ICD-10-CM | POA: Insufficient documentation

## 2011-09-28 HISTORY — DX: Rash and other nonspecific skin eruption: R21

## 2011-09-28 HISTORY — PX: LUMBAR LAMINECTOMY/DECOMPRESSION MICRODISCECTOMY: SHX5026

## 2011-09-28 LAB — GLUCOSE, CAPILLARY: Glucose-Capillary: 146 mg/dL — ABNORMAL HIGH (ref 70–99)

## 2011-09-28 SURGERY — LUMBAR LAMINECTOMY/DECOMPRESSION MICRODISCECTOMY
Anesthesia: General | Site: Back | Laterality: Right | Wound class: Clean

## 2011-09-28 MED ORDER — BUPIVACAINE-EPINEPHRINE 0.25% -1:200000 IJ SOLN
INTRAMUSCULAR | Status: DC | PRN
  Administered 2011-09-28: 10 mL

## 2011-09-28 MED ORDER — GABAPENTIN 300 MG PO CAPS
300.0000 mg | ORAL_CAPSULE | Freq: Three times a day (TID) | ORAL | Status: DC
  Administered 2011-09-28 – 2011-09-29 (×2): 300 mg via ORAL
  Filled 2011-09-28 (×4): qty 1

## 2011-09-28 MED ORDER — ACETAMINOPHEN 10 MG/ML IV SOLN
1000.0000 mg | Freq: Four times a day (QID) | INTRAVENOUS | Status: DC
  Administered 2011-09-28 – 2011-09-29 (×3): 1000 mg via INTRAVENOUS
  Filled 2011-09-28 (×4): qty 100

## 2011-09-28 MED ORDER — OXYCODONE HCL 5 MG PO TABS
5.0000 mg | ORAL_TABLET | ORAL | Status: DC | PRN
  Administered 2011-09-28 – 2011-09-29 (×4): 10 mg via ORAL
  Filled 2011-09-28 (×4): qty 2

## 2011-09-28 MED ORDER — MOMETASONE FURO-FORMOTEROL FUM 100-5 MCG/ACT IN AERO: 2.0000 | INHALATION_SPRAY | Freq: Every day | RESPIRATORY_TRACT | Status: DC | PRN

## 2011-09-28 MED ORDER — ACETAMINOPHEN 10 MG/ML IV SOLN
INTRAVENOUS | Status: AC
  Filled 2011-09-28: qty 100

## 2011-09-28 MED ORDER — THROMBIN 20000 UNITS EX KIT
PACK | CUTANEOUS | Status: DC | PRN
  Administered 2011-09-28: 14:00:00 via TOPICAL

## 2011-09-28 MED ORDER — MORPHINE SULFATE 2 MG/ML IJ SOLN
INTRAMUSCULAR | Status: AC
  Filled 2011-09-28: qty 1

## 2011-09-28 MED ORDER — DIPHENHYDRAMINE HCL 50 MG/ML IJ SOLN
INTRAMUSCULAR | Status: DC | PRN
  Administered 2011-09-28: 25 mg via INTRAVENOUS

## 2011-09-28 MED ORDER — METHOCARBAMOL 100 MG/ML IJ SOLN
500.0000 mg | Freq: Four times a day (QID) | INTRAVENOUS | Status: DC | PRN
  Filled 2011-09-28: qty 5

## 2011-09-28 MED ORDER — HYDROCHLOROTHIAZIDE 25 MG PO TABS
25.0000 mg | ORAL_TABLET | Freq: Every day | ORAL | Status: DC
  Administered 2011-09-28 – 2011-09-29 (×2): 25 mg via ORAL
  Filled 2011-09-28 (×2): qty 1

## 2011-09-28 MED ORDER — ZOLPIDEM TARTRATE 10 MG PO TABS: 10.0000 mg | ORAL_TABLET | Freq: Every evening | ORAL | Status: DC | PRN

## 2011-09-28 MED ORDER — HYDROMORPHONE HCL PF 1 MG/ML IJ SOLN
0.2500 mg | INTRAMUSCULAR | Status: DC | PRN
  Administered 2011-09-28 (×2): 0.5 mg via INTRAVENOUS

## 2011-09-28 MED ORDER — ACETAMINOPHEN 10 MG/ML IV SOLN
1000.0000 mg | Freq: Once | INTRAVENOUS | Status: AC
  Administered 2011-09-28: 1000 mg via INTRAVENOUS
  Filled 2011-09-28: qty 100

## 2011-09-28 MED ORDER — LACTATED RINGERS IV SOLN: INTRAVENOUS | Status: DC

## 2011-09-28 MED ORDER — PROMETHAZINE HCL 25 MG/ML IJ SOLN: 6.2500 mg | INTRAMUSCULAR | Status: DC | PRN

## 2011-09-28 MED ORDER — SODIUM CHLORIDE 0.9 % IJ SOLN: 3.0000 mL | INTRAMUSCULAR | Status: DC | PRN

## 2011-09-28 MED ORDER — FENTANYL CITRATE 0.05 MG/ML IJ SOLN
INTRAMUSCULAR | Status: DC | PRN
  Administered 2011-09-28: 150 ug via INTRAVENOUS
  Administered 2011-09-28: 100 ug via INTRAVENOUS

## 2011-09-28 MED ORDER — DEXAMETHASONE SODIUM PHOSPHATE 4 MG/ML IJ SOLN
4.0000 mg | Freq: Four times a day (QID) | INTRAMUSCULAR | Status: DC
  Administered 2011-09-28: 4 mg via INTRAVENOUS
  Filled 2011-09-28 (×7): qty 1

## 2011-09-28 MED ORDER — MORPHINE SULFATE 2 MG/ML IJ SOLN: 0.0500 mg/kg | INTRAMUSCULAR | Status: DC | PRN

## 2011-09-28 MED ORDER — VECURONIUM BROMIDE 10 MG IV SOLR
INTRAVENOUS | Status: DC | PRN
  Administered 2011-09-28: 8 mg via INTRAVENOUS

## 2011-09-28 MED ORDER — HEMOSTATIC AGENTS (NO CHARGE) OPTIME
TOPICAL | Status: DC | PRN
  Administered 2011-09-28: 1 via TOPICAL

## 2011-09-28 MED ORDER — CEFAZOLIN SODIUM 1-5 GM-% IV SOLN
1.0000 g | Freq: Three times a day (TID) | INTRAVENOUS | Status: AC
  Administered 2011-09-28 – 2011-09-29 (×2): 1 g via INTRAVENOUS
  Filled 2011-09-28 (×2): qty 50

## 2011-09-28 MED ORDER — PROPOFOL 10 MG/ML IV EMUL
INTRAVENOUS | Status: DC | PRN
  Administered 2011-09-28: 200 mg via INTRAVENOUS

## 2011-09-28 MED ORDER — ONDANSETRON HCL 4 MG/2ML IJ SOLN: 4.0000 mg | INTRAMUSCULAR | Status: DC | PRN

## 2011-09-28 MED ORDER — ALBUTEROL SULFATE HFA 108 (90 BASE) MCG/ACT IN AERS
2.0000 | INHALATION_SPRAY | Freq: Four times a day (QID) | RESPIRATORY_TRACT | Status: DC | PRN
  Filled 2011-09-28: qty 6.7

## 2011-09-28 MED ORDER — MIDAZOLAM HCL 5 MG/5ML IJ SOLN
INTRAMUSCULAR | Status: DC | PRN
  Administered 2011-09-28: 2 mg via INTRAVENOUS

## 2011-09-28 MED ORDER — SODIUM CHLORIDE 0.9 % IJ SOLN: 3.0000 mL | Freq: Two times a day (BID) | INTRAMUSCULAR | Status: DC

## 2011-09-28 MED ORDER — METHOCARBAMOL 500 MG PO TABS
500.0000 mg | ORAL_TABLET | Freq: Four times a day (QID) | ORAL | Status: DC | PRN
  Administered 2011-09-29 (×2): 500 mg via ORAL
  Filled 2011-09-28 (×2): qty 1

## 2011-09-28 MED ORDER — GLYCOPYRROLATE 0.2 MG/ML IJ SOLN
INTRAMUSCULAR | Status: DC | PRN
  Administered 2011-09-28: .4 mg via INTRAVENOUS

## 2011-09-28 MED ORDER — LACTATED RINGERS IV SOLN
INTRAVENOUS | Status: DC | PRN
  Administered 2011-09-28 (×2): via INTRAVENOUS

## 2011-09-28 MED ORDER — DEXAMETHASONE SODIUM PHOSPHATE 10 MG/ML IJ SOLN
10.0000 mg | Freq: Once | INTRAMUSCULAR | Status: AC
  Administered 2011-09-28: 10 mg via INTRAVENOUS
  Filled 2011-09-28: qty 1

## 2011-09-28 MED ORDER — 0.9 % SODIUM CHLORIDE (POUR BTL) OPTIME
TOPICAL | Status: DC | PRN
  Administered 2011-09-28: 1000 mL

## 2011-09-28 MED ORDER — VALSARTAN-HYDROCHLOROTHIAZIDE 160-25 MG PO TABS: 1.0000 | ORAL_TABLET | Freq: Every day | ORAL | Status: DC

## 2011-09-28 MED ORDER — ONDANSETRON HCL 4 MG/2ML IJ SOLN
INTRAMUSCULAR | Status: DC | PRN
  Administered 2011-09-28: 4 mg via INTRAVENOUS

## 2011-09-28 MED ORDER — NEOSTIGMINE METHYLSULFATE 1 MG/ML IJ SOLN
INTRAMUSCULAR | Status: DC | PRN
  Administered 2011-09-28: 3 mg via INTRAVENOUS

## 2011-09-28 MED ORDER — HYDROMORPHONE HCL 2 MG PO TABS: 2.0000 mg | ORAL_TABLET | Freq: Four times a day (QID) | ORAL | Status: DC | PRN

## 2011-09-28 MED ORDER — OLMESARTAN MEDOXOMIL 20 MG PO TABS
20.0000 mg | ORAL_TABLET | Freq: Every day | ORAL | Status: DC
  Administered 2011-09-28 – 2011-09-29 (×2): 20 mg via ORAL
  Filled 2011-09-28 (×2): qty 1

## 2011-09-28 MED ORDER — METHOCARBAMOL 100 MG/ML IJ SOLN
500.0000 mg | Freq: Once | INTRAVENOUS | Status: AC
  Administered 2011-09-28: 500 mg via INTRAVENOUS
  Filled 2011-09-28: qty 5

## 2011-09-28 MED ORDER — MEPERIDINE HCL 25 MG/ML IJ SOLN: 6.2500 mg | INTRAMUSCULAR | Status: DC | PRN

## 2011-09-28 MED ORDER — MORPHINE SULFATE 2 MG/ML IJ SOLN
1.0000 mg | INTRAMUSCULAR | Status: DC | PRN
  Administered 2011-09-28 – 2011-09-29 (×2): 2 mg via INTRAVENOUS
  Filled 2011-09-28 (×2): qty 1

## 2011-09-28 MED ORDER — SODIUM CHLORIDE 0.9 % IV SOLN: 250.0000 mL | INTRAVENOUS | Status: DC

## 2011-09-28 MED ORDER — DEXAMETHASONE 4 MG PO TABS
4.0000 mg | ORAL_TABLET | Freq: Four times a day (QID) | ORAL | Status: DC
  Administered 2011-09-28 – 2011-09-29 (×3): 4 mg via ORAL
  Filled 2011-09-28 (×7): qty 1

## 2011-09-28 SURGICAL SUPPLY — 58 items
ADH SKN CLS APL DERMABOND .7 (GAUZE/BANDAGES/DRESSINGS)
BUR EGG ELITE 4.0 (BURR) IMPLANT
BUR MATCHSTICK NEURO 3.0 LAGG (BURR) ×1 IMPLANT
CANISTER SUCTION 2500CC (MISCELLANEOUS) ×2 IMPLANT
CLOTH BEACON ORANGE TIMEOUT ST (SAFETY) ×2 IMPLANT
CORDS BIPOLAR (ELECTRODE) ×2 IMPLANT
COVER SURGICAL LIGHT HANDLE (MISCELLANEOUS) ×2 IMPLANT
DERMABOND ADVANCED (GAUZE/BANDAGES/DRESSINGS)
DERMABOND ADVANCED .7 DNX12 (GAUZE/BANDAGES/DRESSINGS) ×1 IMPLANT
DRAIN CHANNEL 15F RND FF W/TCR (WOUND CARE) ×2 IMPLANT
DRAPE POUCH INSTRU U-SHP 10X18 (DRAPES) ×2 IMPLANT
DRAPE SURG 17X23 STRL (DRAPES) ×1 IMPLANT
DRAPE U-SHAPE 47X51 STRL (DRAPES) ×2 IMPLANT
DRSG MEPILEX BORDER 4X8 (GAUZE/BANDAGES/DRESSINGS) ×2 IMPLANT
DURAPREP 26ML APPLICATOR (WOUND CARE) ×2 IMPLANT
ELECT BLADE 4.0 EZ CLEAN MEGAD (MISCELLANEOUS) ×2
ELECT CAUTERY BLADE 6.4 (BLADE) ×2 IMPLANT
ELECT REM PT RETURN 9FT ADLT (ELECTROSURGICAL) ×2
ELECTRODE BLDE 4.0 EZ CLN MEGD (MISCELLANEOUS) IMPLANT
ELECTRODE REM PT RTRN 9FT ADLT (ELECTROSURGICAL) ×1 IMPLANT
EVACUATOR 1/8 PVC DRAIN (DRAIN) IMPLANT
EVACUATOR SILICONE 100CC (DRAIN) ×2 IMPLANT
GLOVE BIOGEL PI IND STRL 6.5 (GLOVE) ×1 IMPLANT
GLOVE BIOGEL PI IND STRL 8.5 (GLOVE) ×1 IMPLANT
GLOVE BIOGEL PI INDICATOR 6.5 (GLOVE) ×1
GLOVE BIOGEL PI INDICATOR 8.5 (GLOVE) ×1
GLOVE ECLIPSE 6.0 STRL STRAW (GLOVE) ×2 IMPLANT
GLOVE ECLIPSE 8.5 STRL (GLOVE) ×2 IMPLANT
GOWN PREVENTION PLUS XXLARGE (GOWN DISPOSABLE) ×2 IMPLANT
GOWN STRL NON-REIN LRG LVL3 (GOWN DISPOSABLE) ×4 IMPLANT
KIT BASIN OR (CUSTOM PROCEDURE TRAY) ×2 IMPLANT
KIT ROOM TURNOVER OR (KITS) ×2 IMPLANT
NDL SPNL 18GX3.5 QUINCKE PK (NEEDLE) ×2 IMPLANT
NEEDLE 22X1 1/2 (OR ONLY) (NEEDLE) ×2 IMPLANT
NEEDLE SPNL 18GX3.5 QUINCKE PK (NEEDLE) ×4 IMPLANT
NS IRRIG 1000ML POUR BTL (IV SOLUTION) ×2 IMPLANT
PACK LAMINECTOMY ORTHO (CUSTOM PROCEDURE TRAY) ×2 IMPLANT
PACK UNIVERSAL I (CUSTOM PROCEDURE TRAY) ×2 IMPLANT
PAD ARMBOARD 7.5X6 YLW CONV (MISCELLANEOUS) ×4 IMPLANT
PATTIES SURGICAL .5 X.5 (GAUZE/BANDAGES/DRESSINGS) IMPLANT
PATTIES SURGICAL .5 X1 (DISPOSABLE) ×1 IMPLANT
SPONGE SURGIFOAM ABS GEL 100 (HEMOSTASIS) ×1 IMPLANT
STRIP CLOSURE SKIN 1/2X4 (GAUZE/BANDAGES/DRESSINGS) ×2 IMPLANT
SURGIFLO TRUKIT (HEMOSTASIS) ×1 IMPLANT
SUT MNCRL AB 3-0 PS2 18 (SUTURE) ×2 IMPLANT
SUT VIC AB 0 CT1 27 (SUTURE)
SUT VIC AB 0 CT1 27XBRD ANBCTR (SUTURE) ×1 IMPLANT
SUT VIC AB 1 CT1 27 (SUTURE) ×2
SUT VIC AB 1 CT1 27XBRD ANBCTR (SUTURE) IMPLANT
SUT VIC AB 1 CTX 36 (SUTURE)
SUT VIC AB 1 CTX36XBRD ANBCTR (SUTURE) ×2 IMPLANT
SUT VIC AB 2-0 CT1 18 (SUTURE) ×2 IMPLANT
SYR BULB IRRIGATION 50ML (SYRINGE) ×2 IMPLANT
SYR CONTROL 10ML LL (SYRINGE) ×2 IMPLANT
TOWEL OR 17X24 6PK STRL BLUE (TOWEL DISPOSABLE) ×2 IMPLANT
TOWEL OR 17X26 10 PK STRL BLUE (TOWEL DISPOSABLE) ×2 IMPLANT
WATER STERILE IRR 1000ML POUR (IV SOLUTION) ×2 IMPLANT
YANKAUER SUCT BULB TIP NO VENT (SUCTIONS) ×2 IMPLANT

## 2011-09-28 NOTE — Addendum Note (Signed)
Addendum  created 09/28/11 1554 by Judie Petit, MD   Modules edited:Orders, PRL Based Order Sets

## 2011-09-28 NOTE — Op Note (Signed)
OPERATIVE REPORT  DATE OF SURGERY: 09/28/2011  PATIENT NAME:  Monique Kirby MRN: [id number] DOB: June 25, 1956  PCP: Enrique Sack, MD, MD  PRE-OPERATIVE DIAGNOSIS:  L3-4 herniation right side  POST-OPERATIVE DIAGNOSIS:  Same  PROCEDURE:   Right L3-4 discectomy Intraoperative findings posterior lateral to the right disc herniation removed without complication Was asleep with a dural elevator circumferentially and along the L4 nerve root towards the L4 foramen was no further neurotension  SURGEON:  Venita Lick, MD  PHYSICIAN ASSISTANT: None   ANESTHESIA:   General  EBL: 20 ml   BRIEF HISTORY: Monique Kirby is a 56 y.o. female who since to my office with complaints of significant back buttock and right leg pain. Patient's clinical exam was consistent with an L4 right radiculopathy. An MRI confirmed the diagnosis of the posterior lateral the right disc herniation at L3-4. After discussing treatment options she elected to proceed with surgical intervention. All appropriate risks benefits and alternatives were discussed with the patient and consent was obtained.  PROCEDURE DETAILS: Patient was brought into the operating room. After successful induction of general anesthesia and tracheal intubation a Time Out was done. This confirmed all pertinent important data. She was turned prone onto the Wilson frame all bony prominences well padded the back was prepped and draped in a standard fashion. Ted's and SCDs were also applied.  2 needles were placed into the back Mark at the incision and x-ray was taken to identify the L3-4 disc space midline incision was mapped out and was infiltrated with quarter percent Marcaine. Sharp dissection was carried out down to the deep fascia. The fascia sharply incised and strip the paraspinal muscles to expose the L3 and L4 spinous process and lamina. I now good visualization the L3-4 interspace. I placed a Penfield 4 underneath the L3 lamina and took a  second x-ray to confirm that I was at the appropriate level. Once I confirmed this used I nerve hook to the release the ligamentum flavum from the undersurface and any laminotomy of 3 was performed in a careful I then released the ligamentum flavum from the into the L4 lamina using a curette to this allowed me to elevate resect the ligamentum flavum to expose the underlying thecal sac. I then dissected into the lateral gutter performing a very partial facetectomy. At this point time I could now mobilize the nerve root and thecal sac and expose the posterior lateral disc herniation on proceeding disc herniation clearly I then retracted the thecal sac incise the annulus and using a combination of pituitary rongeurs and nerve roots I removed all the disc material.  I then used bipolar artery to obtain hemostasis. I then used a ball-tip nerve hook and felt circumferentially at the level of the disc space superiorly medially inferiorly and laterally. There was no further fragmented disc and the nerve root itself was no longer under tension and dural elevator in length along the lateral recess towards the L4 foramen. This allowed me to traversing L4 nerve root and ensure that it was free from tension. I then took the neural elements are circumferentially disc space and again to further was no further fragments of disc material.  At this point time satisfied that I had complete discectomy and began irrigating copiously with saline I was able to repair the amount of disc material that remove waxing a preop on her preoperative MRI and I was satisfied that there were similar in amount. Her graft I then irrigated copiously normal  saline placed thrombin-soaked Gelfoam padding over the exposed lamina and then closed the deep fascia with interrupted #1 Vicryl sutures. I then closed superficial 2- 0 Monocryl for the skin. Steri-Strips dry dressing were applied. At the end of the case all needle and sponge counts were correct   and there was no adverse intraoperative event.  Venita Lick, MD 09/28/2011 2:39 PM

## 2011-09-28 NOTE — Addendum Note (Signed)
Addendum  created 09/28/11 1553 by Judie Petit, MD   Modules edited:Orders

## 2011-09-28 NOTE — Preoperative (Signed)
Beta Blockers   Reason not to administer Beta Blockers:Not Applicable 

## 2011-09-28 NOTE — Transfer of Care (Signed)
Immediate Anesthesia Transfer of Care Note  Patient: Monique Kirby  Procedure(s) Performed:  LUMBAR LAMINECTOMY/DECOMPRESSION MICRODISCECTOMY - RIGHT L3-4 DISCECTOMY  Patient Location: PACU  Anesthesia Type: General  Level of Consciousness: awake, alert  and oriented  Airway & Oxygen Therapy: Patient Spontanous Breathing and Patient connected to nasal cannula oxygen  Post-op Assessment: Report given to PACU RN, Post -op Vital signs reviewed and stable, Patient moving all extremities and Patient able to stick tongue midline  Post vital signs: Reviewed and stable Filed Vitals:   09/28/11 1505  BP:   Pulse:   Temp: 37 C  Resp:     Complications: No apparent anesthesia complications

## 2011-09-28 NOTE — Anesthesia Postprocedure Evaluation (Signed)
  Anesthesia Post-op Note  Patient: Monique Kirby  Procedure(s) Performed:  LUMBAR LAMINECTOMY/DECOMPRESSION MICRODISCECTOMY - RIGHT L3-4 DISCECTOMY  Patient Location: PACU  Anesthesia Type: General  Level of Consciousness: awake  Airway and Oxygen Therapy: Patient Spontanous Breathing  Post-op Pain: mild  Post-op Assessment: Post-op Vital signs reviewed  Post-op Vital Signs: stable  Complications: No apparent anesthesia complications

## 2011-09-28 NOTE — Anesthesia Preprocedure Evaluation (Addendum)
Anesthesia Evaluation  Patient identified by MRN, date of birth, ID band Patient awake    Reviewed: Allergy & Precautions, H&P , NPO status , Patient's Chart, lab work & pertinent test results, reviewed documented beta blocker date and time   Airway Mallampati: II      Dental  (+) Upper Dentures   Pulmonary asthma ,  clear to auscultation        Cardiovascular hypertension, Pt. on medications Regular Normal    Neuro/Psych  Headaches,    GI/Hepatic negative GI ROS, Neg liver ROS,   Endo/Other  Negative Endocrine ROS  Renal/GU negative Renal ROS     Musculoskeletal   Abdominal   Peds  Hematology negative hematology ROS (+)   Anesthesia Other Findings   Reproductive/Obstetrics                          Anesthesia Physical Anesthesia Plan  ASA: III  Anesthesia Plan: General   Post-op Pain Management:    Induction: Intravenous  Airway Management Planned: Oral ETT  Additional Equipment:   Intra-op Plan:   Post-operative Plan:   Informed Consent: I have reviewed the patients History and Physical, chart, labs and discussed the procedure including the risks, benefits and alternatives for the proposed anesthesia with the patient or authorized representative who has indicated his/her understanding and acceptance.     Plan Discussed with: CRNA  Anesthesia Plan Comments:         Anesthesia Quick Evaluation

## 2011-09-28 NOTE — Anesthesia Procedure Notes (Addendum)
Procedure Name: Intubation Date/Time: 09/28/2011 1:14 PM Performed by: Rosita Fire Pre-anesthesia Checklist: Patient identified, Emergency Drugs available, Suction available, Patient being monitored and Timeout performed Patient Re-evaluated:Patient Re-evaluated prior to inductionOxygen Delivery Method: Circle System Utilized Preoxygenation: Pre-oxygenation with 100% oxygen Intubation Type: IV induction Ventilation: Mask ventilation without difficulty Laryngoscope Size: Mac and 3 Grade View: Grade I Tube size: 7.0 mm Number of attempts: 1 Placement Confirmation: ETT inserted through vocal cords under direct vision,  breath sounds checked- equal and bilateral,  positive ETCO2 and CO2 detector Secured at: 23 cm Tube secured with: Tape Dental Injury: Teeth and Oropharynx as per pre-operative assessment

## 2011-09-28 NOTE — Progress Notes (Addendum)
Pt admitted from PACU following a lumbar laminectomy decompression and microdisk of L3-L4. Neuro check is negative. Pt's family is present. Pt is alert and oriented x 3. Pt has two mepilex dressings to low back that are dry and intact. Pt is to void. Lungs CTA. No s/sx cardiac or resp distress and no c/o such. Vital signs are stable. PAS and TED are on. Pt repts LBM this AM. Abdomen is soft flat and nontender and nondistended but BS faint x 4. Will place pt on clear liquid diet for now. Will continue to monitor.

## 2011-09-29 ENCOUNTER — Encounter (HOSPITAL_COMMUNITY): Payer: Self-pay | Admitting: Orthopedic Surgery

## 2011-09-29 MED ORDER — FLUTICASONE-SALMETEROL 100-50 MCG/DOSE IN AEPB: 1.0000 | INHALATION_SPRAY | Freq: Every day | RESPIRATORY_TRACT | Status: DC | PRN

## 2011-09-29 MED ORDER — OXYCODONE-ACETAMINOPHEN 10-325 MG PO TABS: 1.0000 | ORAL_TABLET | Freq: Four times a day (QID) | ORAL | Status: AC | PRN

## 2011-09-29 MED ORDER — ONDANSETRON HCL 4 MG PO TABS: 4.0000 mg | ORAL_TABLET | Freq: Three times a day (TID) | ORAL | Status: AC | PRN

## 2011-09-29 MED ORDER — POLYETHYLENE GLYCOL 3350 17 G PO PACK: 17.0000 g | PACK | Freq: Every day | ORAL | Status: AC

## 2011-09-29 MED ORDER — METHOCARBAMOL 500 MG PO TABS: 500.0000 mg | ORAL_TABLET | Freq: Three times a day (TID) | ORAL | Status: AC

## 2011-09-29 NOTE — Progress Notes (Addendum)
Discharge planning. No Home Health needs, pt requested a tub transfer bench, entered in [facility].

## 2011-09-29 NOTE — Progress Notes (Signed)
Monique Kirby 56 y.o. 09/28/2011  right L3-4 disc herniation  1 Day Post-Op   Subjective doing well without problems  Objective decreased pain Negative st leg raise No sob/cp peripheral pulses symmetrical abdomen is soft without significant tenderness, masses, organomegaly or guarding dressing C/D/I  Lab. Results: No results found for this basename:  WBC:2,HGB:2,HCT:2,PLT:2 in the last 72 hours BMET No results found for this basename:  NA:2,K:2,CL:2,CO2:2,GLUCOSE:2,BUN:2,CREATININE:2,CALCIUM:2,CBG:2 in the last 72 hours  INR (no units)  Date                       Value       Range   Status  02/08/2011                   0.99    Final ---------- VITALS ---------------------------              09/29/11                     0526        ---------------------------  BP:          117/65        Pulse:         77          Temp:   98.8 F (37.1 C)  Resp:          18         ---------------------------  Dg Lumbar Spine 2-3 Views  09/28/2011  *RADIOLOGY REPORT*  Clinical Data: Back pain  LUMBAR SPINE - 2-3 VIEW  Comparison: 09/25/2011  Findings: Film #1 demonstrates needles directed most closely at the L3 and L4 vertebrae.  Film #2 demonstrates an angled probe directed most closely toward the L3-L4 disc space.  IMPRESSION: As above.  Original Report Authenticated By: Elsie Stain, M.D.   Dg Lumbar Spine 2-3 Views  09/25/2011  *RADIOLOGY REPORT*  Clinical Data: Back pain  LUMBAR SPINE - 2-3 VIEW  Comparison: None.  Findings: Mild degenerative scoliosis convex left secondary to loss of interspace height at L3-L4 on the right.  There is anatomic alignment otherwise.  There is no compression fracture or subluxation.  The films are numbered in accordance with the convention of five lumbar type vertebral bodies.  IMPRESSION: L3-4 disc disease as described.  Original Report Authenticated By: Elsie Stain, M.D.     Assessment/ Plan Doing well  postoperatively. Good Home in 2 weeks      Sosaia Pittinger D 1/11/20137:49 AM

## 2011-09-29 NOTE — Discharge Summary (Signed)
Patient ID: Monique Kirby MRN: [id number] DOB/AGE: 57-Dec-1957 57 y.o.  Admit date: 09/28/2011 Discharge date: 09/29/2011  Admission Diagnoses:  L3-4 HNP  Discharge Diagnoses:  L3-4 HNP status post LUMBAR LAMINECTOMY/DECOMPRESSION MICRODISCECTOMY    Past Medical History  Diagnosis Date  . Asthma   . Headache     migranes  . Hypertension   . Rash Tuesday pm 09/26/11    left upper leg ...     Surgeries: Procedure(s): LUMBAR LAMINECTOMY/DECOMPRESSION MICRODISCECTOMY on 09/28/2011   Consultants: none  Discharged Condition: Improved  Hospital Course: Monique Kirby is an 56 y.o. female who was admitted 09/28/2011 for operative treatment of L3-4 right-sided HNP. Patient failed conservative treatments (please see the history and physical for the specifics) and had severe unremitting pain that affects sleep, daily activities, and work/hobbies. After pre-op clearance the patient was taken to the operating room on 09/28/2011 and underwent  Procedure(s): LUMBAR LAMINECTOMY/DECOMPRESSION MICRODISCECTOMY.    Patient was given perioperative antibiotics: Anti-infectives     Start     Dose/Rate Route Frequency Ordered Stop   09/28/11 2200   ceFAZolin (ANCEF) IVPB 1 g/50 mL premix        1 g 100 mL/hr over 30 Minutes Intravenous 3 times per day 09/28/11 1835 09/29/11 0600   09/27/11 1545   ceFAZolin (ANCEF) IVPB 2 g/50 mL premix        2 g 100 mL/hr over 30 Minutes Intravenous 60 min pre-op 09/27/11 1534 09/28/11 1308           Patient was given sequential compression devices and early ambulation to prevent DVT.   Patient benefited maximally from hospital stay and there were no complications. At the time of discharge, the patient was moving their bowels without difficulty, tolerating a regular diet, pain is controlled with oral pain medications and they have been cleared by PT/OT.   Recent vital signs: Patient Vitals for the past 24 hrs:  BP Temp Temp src Pulse Resp SpO2    09/29/11 0526 117/65 mmHg 98.8 F (37.1 C) - 77  18  99 %  09/29/11 0213 134/62 mmHg 98 F (36.7 C) - 91  18  99 %  09/28/11 2211 151/85 mmHg 98 F (36.7 C) - 95  18  100 %  09/28/11 1810 155/73 mmHg 98.4 F (36.9 C) - 103  16  99 %  09/28/11 1745 - 98.4 F (36.9 C) - - - -  09/28/11 1735 154/82 mmHg - - 87  9  100 %  09/28/11 1720 156/79 mmHg - - 93  16  100 %  09/28/11 1707 165/101 mmHg - - 97  17  100 %  09/28/11 1705 - - - - - 100 %  09/28/11 1650 167/85 mmHg - - 98  15  100 %  09/28/11 1635 150/81 mmHg - - 86  12  100 %  09/28/11 1620 149/88 mmHg - - 89  20  100 %  09/28/11 1605 157/80 mmHg - - 84  11  100 %  09/28/11 1550 154/74 mmHg - - 82  14  100 %  09/28/11 1535 157/86 mmHg - - 85  14  100 %  09/28/11 1531 166/77 mmHg - - 99  18  100 %  09/28/11 1520 173/80 mmHg - - 89  17  100 %  09/28/11 1505 163/71 mmHg 98.6 F (37 C) - 103  38  100 %  09/28/11 1127 178/96 mmHg 98.1 F (36.7 C) Oral 107  20  100 %     Recent laboratory studies: No results found for this basename: WBC:2,HGB:2,HCT:2,PLT:2,NA:2,K:2,CL:2,CO2:2,BUN:2,CREATININE:2,GLUCOSE:2,PT:2,INR:2,CALCIUM,2: in the last 72 hours   Discharge Medications:  Current Discharge Medication List    START taking these medications   Details  ondansetron (ZOFRAN) 4 MG tablet Take 1 tablet (4 mg total) by mouth every 8 (eight) hours as needed for nausea. Take 1 tablet every 8 hours as needed for nausea; MAX 3 pills daily Qty: 30 tablet, Refills: 0    polyethylene glycol (MIRALAX / GLYCOLAX) packet Take 17 g by mouth daily. Take 1 packet daily until bowels become regular Qty: 14 each, Refills: 0      CONTINUE these medications which have CHANGED   Details  methocarbamol (ROBAXIN) 500 MG tablet Take 1 tablet (500 mg total) by mouth 3 (three) times daily. Take 1 pill every 8 hours as needed for muscle spasms; MAX 3 pills daily Qty: 42 tablet, Refills: 0      CONTINUE these medications which have NOT CHANGED   Details   albuterol (PROVENTIL HFA;VENTOLIN HFA) 108 (90 BASE) MCG/ACT inhaler Inhale 2 puffs into the lungs every 6 (six) hours as needed. For shortness of breath.     B Complex-C (B-COMPLEX WITH VITAMIN C) tablet Take 1 tablet by mouth daily.      cetirizine (ZYRTEC) 10 MG tablet Take 10 mg by mouth daily as needed. For allergies.     gabapentin (NEURONTIN) 300 MG capsule Take 300 mg by mouth 3 (three) times daily.      HYDROmorphone (DILAUDID) 2 MG tablet Take 2 mg by mouth 3 (three) times daily as needed. For pain.     mometasone-formoterol (DULERA) 100-5 MCG/ACT AERO Inhale 2 puffs into the lungs daily as needed. For shortness of breath.     valsartan-hydrochlorothiazide (DIOVAN-HCT) 160-25 MG per tablet Take 1 tablet by mouth daily.      vitamin B-12 (CYANOCOBALAMIN) 1000 MCG tablet Take 1,000 mcg by mouth daily.          Diagnostic Studies: Dg Lumbar Spine 2-3 Views  09/28/2011  *RADIOLOGY REPORT*  Clinical Data: Back pain  LUMBAR SPINE - 2-3 VIEW  Comparison: 09/25/2011  Findings: Film #1 demonstrates needles directed most closely at the L3 and L4 vertebrae.  Film #2 demonstrates an angled probe directed most closely toward the L3-L4 disc space.  IMPRESSION: As above.  Original Report Authenticated By: Elsie Stain, M.D.   Dg Lumbar Spine 2-3 Views  09/25/2011  *RADIOLOGY REPORT*  Clinical Data: Back pain  LUMBAR SPINE - 2-3 VIEW  Comparison: None.  Findings: Mild degenerative scoliosis convex left secondary to loss of interspace height at L3-L4 on the right.  There is anatomic alignment otherwise.  There is no compression fracture or subluxation.  The films are numbered in accordance with the convention of five lumbar type vertebral bodies.  IMPRESSION: L3-4 disc disease as described.  Original Report Authenticated By: Elsie Stain, M.D.    Discharge Orders    Future Orders Please Complete By Expires   Diet - low sodium heart healthy      Call MD / Call 911      Comments:   If you  experience chest pain or shortness of breath, CALL 911 and be transported to the hospital emergency room.  If you develope a fever above 101 F, pus (white drainage) or increased drainage or redness at the wound, or calf pain, call your surgeon's office.   Constipation Prevention  Comments:   Drink plenty of fluids.  Prune juice may be helpful.  You may use a stool softener, such as Colace (over the counter) 100 mg twice a day.  Use MiraLax (over the counter) for constipation as needed.   Increase activity slowly as tolerated      Weight Bearing as taught in Physical Therapy      Comments:   Use a walker or crutches as instructed.   Discharge instructions      Comments:   Keep incision clean and dry.  May shower in 5 days; pat dry after showering.  DO NOT apply any lotion/ointment/cream to incision.      Follow-up Information    Follow up with BROOKS,DAHARI D in 2 weeks.   Contact information:   [facility] [address] [place] [address] [phone]          Discharge Plan:  discharge to Home   Disposition: STABLE    Signed: Gwinda Maine 09/29/2011, 7:32 AM

## 2011-09-29 NOTE — Progress Notes (Signed)
Occupational Therapy Evaluation Patient Details Name: Monique Kirby MRN: [id number] DOB: 12-Jun-1956 Today's Date: 09/29/2011  Problem List: There is no problem list on file for this patient.   Past Medical History:  Past Medical History  Diagnosis Date  . Asthma   . Headache     migranes  . Hypertension   . Rash Tuesday pm 09/26/11    left upper leg ...    Past Surgical History:  Past Surgical History  Procedure Date  . 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 Knee arthroscopy 2012  . Dilation and curettage of uterus   . Colonoscopy   . Distal ulna excision   . Wrist surgery   . Heel spur surgery   . Endometrial ablation   . Lumbar laminectomy/decompression microdiscectomy 09/28/2011    Procedure: LUMBAR LAMINECTOMY/DECOMPRESSION MICRODISCECTOMY;  Surgeon: Alvy Beal;  Location: [facility] OR;  Service: Orthopedics;  Laterality: Right;  RIGHT L3-4 DISCECTOMY    OT Assessment/Plan/Recommendation OT Assessment Clinical Impression Statement: Pt is s/p  lumbar discectomy along with the below problem list, pt. with increased pain affecting independence with ADLs. Pt. will benefit from further education on back precautions with ADLs. OT Recommendation/Assessment: Patient will need skilled OT in the acute care venue OT Problem List: Decreased activity tolerance;Pain;Decreased knowledge of use of DME or AE;Decreased knowledge of precautions Barriers to Discharge: None OT Therapy Diagnosis : Acute pain OT Plan OT Frequency: Min 2X/week OT Treatment/Interventions: Self-care/ADL training;DME and/or AE instruction;Therapeutic activities;Patient/family education OT Recommendation Follow Up Recommendations: Home health OT Equipment Recommended: Tub transfer bench Individuals Consulted Consulted and Agree with Results and Recommendations: Patient OT Goals Acute Rehab OT Goals OT Goal Formulation: With patient Time For Goal Achievement: 7 days ADL Goals Pt Will Perform Tub/Shower  Transfer: Tub transfer;with DME;Transfer tub bench;Ambulation;with supervision ADL Goal: Tub/Shower Transfer - Progress: Not met  OT Evaluation Precautions/Restrictions  Precautions Precautions: Back Precaution Booklet Issued: Yes (comment) (Back handout given.) Precaution Comments: Pt able to acknowledge 3/3 back precautions. Required Braces or Orthoses: Yes Spinal Brace: Lumbar corset;Applied in sitting position Restrictions Weight Bearing Restrictions: No Prior Functioning Home Living Lives With: Other (Comment) (mother;sister) Type of Home: House Home Layout: Two level Alternate Level Stairs-Rails: Right Alternate Level Stairs-Number of Steps: 12 Home Access: Level entry Bathroom Shower/Tub: Tub/shower unit;Curtain Bathroom Toilet: Standard Bathroom Accessibility: Yes How Accessible: Accessible via walker Home Adaptive Equipment: Bedside commode/3-in-1;Walker - rolling Prior Function Level of Independence: Independent with basic ADLs;Independent with gait;Independent with transfers Able to Take Stairs?: Yes Driving: Yes ADL ADL Eating/Feeding: Simulated;Independent Where Assessed - Eating/Feeding: Chair Grooming: Performed;Wash/dry hands;Set up;Supervision/safety Where Assessed - Grooming: Standing at sink Upper Body Bathing: Simulated;Chest;Right arm;Left arm;Abdomen;Set up Where Assessed - Upper Body Bathing: Sitting, bed Lower Body Bathing: Simulated;Set up Lower Body Bathing Details (indicate cue type and reason): With crossing foot over opposite knee Where Assessed - Lower Body Bathing: Sitting, bed Upper Body Dressing: Performed;Supervision/safety Upper Body Dressing Details (indicate cue type and reason): with donning gown Where Assessed - Upper Body Dressing: Sitting, bed Lower Body Dressing: Simulated;Set up Lower Body Dressing Details (indicate cue type and reason): With crossing foot over opposite knee to don/doff undergarments/pants/socks Where Assessed -  Lower Body Dressing: Sitting, bed Toilet Transfer: Performed;Set up;Supervision/safety Toilet Transfer Details (indicate cue type and reason): Min verbal cues for ue support on grab bar to complete Toilet Transfer Method: Ambulating Toilet Transfer Equipment: Grab bars Toileting - Clothing Manipulation: Simulated;Set up Toileting - Clothing Manipulation Details (indicate cue type  and reason): with moving gown Where Assessed - Toileting Clothing Manipulation: Standing Toileting - Hygiene: Simulated;Set up Toileting - Hygiene Details (indicate cue type and reason): min verbal cues for no twisting Where Assessed - Toileting Hygiene: Sit to stand from 3-in-1 or toilet Tub/Shower Transfer: Not assessed Equipment Used: Rolling walker Ambulation Related to ADLs: Pt. provided with min hand held assist, anticipate pt. supervision with RW and pt. with slow gait due to increased pain. ADL Comments: Pt. educated on 3/3 back precautions with ADLs and pt. verbalized/demonstrated understanding. Demonstrated use of tub transfer bench transfer techniques for use for bathing at D/C home.   Cognition Cognition Arousal/Alertness: Awake/alert Overall Cognitive Status: Appears within functional limits for tasks assessed Orientation Level: Oriented X4 Sensation/Coordination Sensation Light Touch: Appears Intact Stereognosis: Not tested Hot/Cold: Not tested Proprioception: Not tested Coordination Gross Motor Movements are Fluid and Coordinated: Yes Fine Motor Movements are Fluid and Coordinated: Yes Extremity Assessment RUE Assessment RUE Assessment: Not tested LUE Assessment LUE Assessment: Not tested Mobility  Bed Mobility Bed Mobility: Yes Rolling Left: 5: Supervision;With rail Rolling Left Details (indicate cue type and reason): Verbal cues for sequence. Left Sidelying to Sit: 4: Min assist;HOB flat Left Sidelying to Sit Details (indicate cue type and reason): Assist to translate trunk to  midline with cues for sequence. Transfers Transfers: Yes Sit to Stand: 4: Min assist;With upper extremity assist;From bed Sit to Stand Details (indicate cue type and reason): Assist for balance with cues for hand placement. Stand to Sit: 4: Min assist;With upper extremity assist;To chair/3-in-1 Stand to Sit Details: Assist to slow descent with cues for hand placement.    End of Session OT - End of Session Equipment Utilized During Treatment: Gait belt Activity Tolerance: Patient tolerated treatment well Patient left: in chair;with call bell in reach Nurse Communication: Mobility status for transfers General Behavior During Session: [facility] for tasks performed Cognition: [facility] for tasks performed   Ally Knodel, OTR/L Pager [phone] 09/29/2011, 2:28 PM

## 2011-09-29 NOTE — Progress Notes (Signed)
Physical Therapy Evaluation Patient Details Name: Monique Kirby MRN: [id number] DOB: January 15, 1956 Today's Date: 09/29/2011  Problem List: There is no problem list on file for this patient.   Past Medical History:  Past Medical History  Diagnosis Date  . Asthma   . Headache     migranes  . Hypertension   . Rash Tuesday pm 09/26/11    left upper leg ...    Past Surgical History:  Past Surgical History  Procedure Date  . 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 Knee arthroscopy 2012  . Dilation and curettage of uterus   . Colonoscopy   . Distal ulna excision   . Wrist surgery   . Heel spur surgery   . Endometrial ablation   . Lumbar laminectomy/decompression microdiscectomy 09/28/2011    Procedure: LUMBAR LAMINECTOMY/DECOMPRESSION MICRODISCECTOMY;  Surgeon: Alvy Beal;  Location: [facility] OR;  Service: Orthopedics;  Laterality: Right;  RIGHT L3-4 DISCECTOMY    PT Assessment/Plan/Recommendation PT Assessment Clinical Impression Statement: Pt is a 56 y/o female admitted s/p lumbar discectomy along with the below PT problem list.  Pt would benefit from acute PT to maximize independence and facilitate d/c home with HHPT. PT Recommendation/Assessment: Patient will need skilled PT in the acute care venue PT Problem List: Decreased activity tolerance;Decreased balance;Decreased mobility;Decreased knowledge of use of DME;Decreased knowledge of precautions;Pain Barriers to Discharge: None PT Therapy Diagnosis : Acute pain;Difficulty walking PT Plan PT Frequency: Min 5X/week PT Treatment/Interventions: DME instruction;Gait training;Stair training;Functional mobility training;Therapeutic activities;Balance training;Patient/family education PT Recommendation Follow Up Recommendations: Home health PT Equipment Recommended: None recommended by PT PT Goals  Acute Rehab PT Goals PT Goal Formulation: With patient Time For Goal Achievement: 7 days Pt will Roll Supine to Left Side: with  modified independence PT Goal: Rolling Supine to Left Side - Progress: Not met Pt will go Supine/Side to Sit: with modified independence PT Goal: Supine/Side to Sit - Progress: Not met Pt will go Sit to Supine/Side: with modified independence PT Goal: Sit to Supine/Side - Progress: Not met Pt will go Sit to Stand: with modified independence PT Goal: Sit to Stand - Progress: Not met Pt will go Stand to Sit: with modified independence PT Goal: Stand to Sit - Progress: Not met Pt will Ambulate: >150 feet;with modified independence;with least restrictive assistive device PT Goal: Ambulate - Progress: Not met Pt will Go Up / Down Stairs: Flight;with modified independence;with least restrictive assistive device PT Goal: Up/Down Stairs - Progress: Not met  PT Evaluation Precautions/Restrictions  Precautions Precautions: Back Precaution Booklet Issued: Yes (comment) (Back handout given.) Precaution Comments: Pt able to acknowledge 3/3 back precautions. Required Braces or Orthoses: Yes Spinal Brace: Lumbar corset;Applied in sitting position Restrictions Weight Bearing Restrictions: No Prior Functioning  Home Living Lives With: Other (Comment) (mother;sister) Type of Home: House Home Layout: Two level Alternate Level Stairs-Rails: Right Alternate Level Stairs-Number of Steps: 12 Home Access: Level entry Bathroom Shower/Tub: Tub/shower unit;Curtain Bathroom Toilet: Standard Bathroom Accessibility: Yes How Accessible: Accessible via walker Home Adaptive Equipment: Bedside commode/3-in-1;Walker - rolling Prior Function Level of Independence: Independent with basic ADLs;Independent with gait;Independent with transfers Able to Take Stairs?: Yes Driving: Yes Cognition Cognition Arousal/Alertness: Awake/alert Overall Cognitive Status: Appears within functional limits for tasks assessed Orientation Level: Oriented X4 Sensation/Coordination Sensation Light Touch: Appears  Intact Stereognosis: Not tested Hot/Cold: Not tested Proprioception: Not tested Coordination Gross Motor Movements are Fluid and Coordinated: Yes Fine Motor Movements are Fluid and Coordinated: Yes Extremity Assessment RUE Assessment  RUE Assessment: Not tested LUE Assessment LUE Assessment: Not tested RLE Assessment RLE Assessment: Within Functional Limits LLE Assessment LLE Assessment: Within Functional Limits Pain 7/10 in back.  RN aware and pt repositioned. Mobility (including Balance) Bed Mobility Bed Mobility: Yes Rolling Left: 5: Supervision;With rail Rolling Left Details (indicate cue type and reason): Verbal cues for sequence. Left Sidelying to Sit: 4: Min assist;HOB flat Left Sidelying to Sit Details (indicate cue type and reason): Assist to translate trunk to midline with cues for sequence. Transfers Transfers: Yes Sit to Stand: 4: Min assist;With upper extremity assist;From bed Sit to Stand Details (indicate cue type and reason): Assist for balance with cues for hand placement. Stand to Sit: 4: Min assist;With upper extremity assist;To chair/3-in-1 Stand to Sit Details: Assist to slow descent with cues for hand placement. Ambulation/Gait Ambulation/Gait: Yes Ambulation/Gait Assistance: 4: Min assist Ambulation/Gait Assistance Details (indicate cue type and reason): Assist for balance with cues for tall posture and safety. Ambulation Distance (Feet): 50 Feet Assistive device: 1 person hand held assist Gait Pattern: Step-through pattern Stairs: No Wheelchair Mobility Wheelchair Mobility: No  Posture/Postural Control Posture/Postural Control: No significant limitations Balance Balance Assessed: No End of Session PT - End of Session Equipment Utilized During Treatment: Back brace Activity Tolerance: Patient tolerated treatment well;Patient limited by pain Patient left: in chair;with call bell in reach Nurse Communication: Mobility status for transfers;Mobility  status for ambulation General Behavior During Session: [facility] for tasks performed Cognition: [facility] for tasks performed Co-evaluation with OT.  Pristine Gladhill M 09/29/2011, 1:18 PM  09/29/2011 Cephus Shelling, PT, DPT [phone]

## 2011-09-29 NOTE — Progress Notes (Signed)
D/C INSTRUCTIONS REVIEWED WITH PT AND FAMILY MEMBER. PT VERBALIZED UNDERSTANDING OF ALL INSTRUCTIONS. COPY OF INSTRUCTIONS AND SCRIPTS GIVEN TO PT. PT WAITING AT THIS TIME FOR RIDE HOME TO ARRIVE.  PT INSTRUCTED TO CALL NURSING STATION WHEN RIDE ARRIVES. PT STATED SHE WOULD CALL.

## 2012-01-01 ENCOUNTER — Other Ambulatory Visit: Payer: Self-pay | Admitting: Internal Medicine

## 2012-01-01 ENCOUNTER — Ambulatory Visit
Admission: RE | Admit: 2012-01-01 | Discharge: 2012-01-01 | Disposition: A | Discharge status: Home / Self Care | Payer: BC Managed Care – PPO | Source: Ambulatory Visit | Attending: Internal Medicine | Admitting: Internal Medicine

## 2012-01-01 DIAGNOSIS — Z111 Encounter for screening for respiratory tuberculosis: Secondary | ICD-10-CM

## 2012-09-25 ENCOUNTER — Other Ambulatory Visit: Payer: Self-pay | Admitting: Obstetrics & Gynecology

## 2012-09-25 DIAGNOSIS — R928 Other abnormal and inconclusive findings on diagnostic imaging of breast: Secondary | ICD-10-CM

## 2012-10-04 ENCOUNTER — Ambulatory Visit
Admission: RE | Admit: 2012-10-04 | Discharge: 2012-10-04 | Disposition: A | Discharge status: Home / Self Care | Payer: BC Managed Care – PPO | Source: Ambulatory Visit | Attending: Obstetrics & Gynecology | Admitting: Obstetrics & Gynecology

## 2012-10-04 DIAGNOSIS — R928 Other abnormal and inconclusive findings on diagnostic imaging of breast: Secondary | ICD-10-CM

## 2014-12-10 ENCOUNTER — Other Ambulatory Visit: Payer: Self-pay | Admitting: Obstetrics & Gynecology

## 2014-12-11 LAB — CYTOLOGY - PAP

## 2015-10-12 ENCOUNTER — Other Ambulatory Visit: Payer: Self-pay | Admitting: Occupational Medicine

## 2015-10-12 ENCOUNTER — Ambulatory Visit: Payer: Self-pay

## 2015-10-12 DIAGNOSIS — M25561 Pain in right knee: Secondary | ICD-10-CM

## 2017-08-30 ENCOUNTER — Emergency Department: Payer: BC Managed Care – PPO

## 2017-08-30 ENCOUNTER — Emergency Department
Admission: EM | Admit: 2017-08-30 | Discharge: 2017-08-30 | Disposition: A | Discharge status: Home / Self Care | Payer: BC Managed Care – PPO | Attending: Emergency Medicine | Admitting: Emergency Medicine

## 2017-08-30 ENCOUNTER — Other Ambulatory Visit: Payer: Self-pay

## 2017-08-30 ENCOUNTER — Encounter: Payer: Self-pay | Admitting: Emergency Medicine

## 2017-08-30 DIAGNOSIS — S59901A Unspecified injury of right elbow, initial encounter: Secondary | ICD-10-CM | POA: Diagnosis present

## 2017-08-30 DIAGNOSIS — J45909 Unspecified asthma, uncomplicated: Secondary | ICD-10-CM | POA: Diagnosis not present

## 2017-08-30 DIAGNOSIS — S53124A Posterior dislocation of right ulnohumeral joint, initial encounter: Secondary | ICD-10-CM | POA: Insufficient documentation

## 2017-08-30 DIAGNOSIS — Y999 Unspecified external cause status: Secondary | ICD-10-CM | POA: Diagnosis not present

## 2017-08-30 DIAGNOSIS — W0110XA Fall on same level from slipping, tripping and stumbling with subsequent striking against unspecified object, initial encounter: Secondary | ICD-10-CM | POA: Insufficient documentation

## 2017-08-30 DIAGNOSIS — Z96651 Presence of right artificial knee joint: Secondary | ICD-10-CM | POA: Insufficient documentation

## 2017-08-30 DIAGNOSIS — Y9301 Activity, walking, marching and hiking: Secondary | ICD-10-CM | POA: Diagnosis not present

## 2017-08-30 DIAGNOSIS — Z91040 Latex allergy status: Secondary | ICD-10-CM | POA: Diagnosis not present

## 2017-08-30 DIAGNOSIS — I10 Essential (primary) hypertension: Secondary | ICD-10-CM | POA: Diagnosis not present

## 2017-08-30 DIAGNOSIS — W19XXXA Unspecified fall, initial encounter: Secondary | ICD-10-CM

## 2017-08-30 DIAGNOSIS — Y929 Unspecified place or not applicable: Secondary | ICD-10-CM | POA: Diagnosis not present

## 2017-08-30 MED ORDER — OXYCODONE HCL 5 MG PO TABS: 5.0000 mg | ORAL_TABLET | ORAL | 0 refills | Status: DC | PRN

## 2017-08-30 MED ORDER — SODIUM CHLORIDE 0.9 % IV BOLUS (SEPSIS)
1000.0000 mL | Freq: Once | INTRAVENOUS | Status: AC
  Administered 2017-08-30: 1000 mL via INTRAVENOUS

## 2017-08-30 MED ORDER — FENTANYL CITRATE (PF) 100 MCG/2ML IJ SOLN
50.0000 ug | Freq: Once | INTRAMUSCULAR | Status: DC
  Filled 2017-08-30: qty 2

## 2017-08-30 MED ORDER — HYDROMORPHONE HCL 1 MG/ML IJ SOLN
INTRAMUSCULAR | Status: AC
  Administered 2017-08-30: 2 mg via INTRAVENOUS
  Filled 2017-08-30: qty 2

## 2017-08-30 MED ORDER — ETOMIDATE 2 MG/ML IV SOLN
INTRAVENOUS | Status: AC | PRN
  Administered 2017-08-30: 6.12 mg via INTRAVENOUS

## 2017-08-30 MED ORDER — ETOMIDATE 2 MG/ML IV SOLN
0.1000 mg/kg | Freq: Once | INTRAVENOUS | Status: DC
  Filled 2017-08-30: qty 10

## 2017-08-30 MED ORDER — HYDROMORPHONE HCL 1 MG/ML IJ SOLN
2.0000 mg | Freq: Once | INTRAMUSCULAR | Status: AC
  Administered 2017-08-30: 2 mg via INTRAVENOUS

## 2017-08-30 MED ORDER — FENTANYL CITRATE (PF) 100 MCG/2ML IJ SOLN
INTRAMUSCULAR | Status: AC | PRN
  Administered 2017-08-30: 12.5 ug via INTRAVENOUS

## 2017-08-30 NOTE — ED Notes (Signed)
Radiology to bedside. 

## 2017-08-30 NOTE — Discharge Instructions (Signed)
Please keep your splint on at all times; do not remove it for showering or sleeping, or any other time.  Please make a follow-up appointment with Dr. Ernest PineHooten, the bone doctor.  You may elevate your elbow above the level of your heart as much as possible to decrease pain and swelling.  You may apply ice on the outside of your splint as well.  For mild to moderate pain, take [phone] mg of Tylenol every 6 hours; do not exceed 3000 mg daily.  For severe pain, you may take oxycodone.    Do not drive until you have been cleared to do so by the orthopedist.  Return to the emergency department for severe pain, numbness tingling or weakness, changes in color of your hand, or for any other symptoms concerning to you.

## 2017-08-30 NOTE — ED Notes (Signed)
Pt is drowsy but arousable, A/Ox4, vitals WDL, NAD noted at this time.

## 2017-08-30 NOTE — ED Triage Notes (Signed)
Pt to triage via w/c, appears uncomfortable; st was walking her dog and he pulled her down landing on her right elbow; denies any other c/o or injuries

## 2017-08-30 NOTE — ED Provider Notes (Addendum)
[facility] Emergency Department Provider Note  ____________________________________________  Time seen: Approximately 7:39 AM  I have reviewed the triage vital signs and the nursing notes.   HISTORY  Chief Complaint Arm Injury    HPI Shaely Arneta ClicheR Pun is a 61 y.o. female with a history of hypertension presenting with right elbow pain and deformity after a fall.  The patient was walking her dog, when she got pulled to the right and slipped on ice, landing on her right upper extremity with immediate right elbow pain.  She denies any numbness or tingling.  She denies any loss of consciousness, or other injury.  She has no neck or back pain, and has been ambulatory since the fall.  Past Medical History:  Diagnosis Date  . Asthma   . Headache(784.0)    migranes  . Hypertension   . Rash Tuesday pm 09/26/11   left upper leg ...     There are no active problems to display for this patient.   Past Surgical History:  Procedure Laterality Date  . COLONOSCOPY    . DILATION AND CURETTAGE OF UTERUS    . DISTAL ULNA EXCISION    . ENDOMETRIAL ABLATION    . HEEL SPUR SURGERY    . JOINT REPLACEMENT  02/14/2011   knee rt  . KNEE ARTHROSCOPY  2012  . LUMBAR LAMINECTOMY/DECOMPRESSION MICRODISCECTOMY  09/28/2011   Procedure: LUMBAR LAMINECTOMY/DECOMPRESSION MICRODISCECTOMY;  Surgeon: Alvy Bealahari D Brooks;  Location: [facility] OR;  Service: Orthopedics;  Laterality: Right;  RIGHT L3-4 DISCECTOMY  . WRIST SURGERY      Current Outpatient Rx  . Order #: [id number] Class: Historical Med  . Order #: [id number] Class: Historical Med  . Order #: [id number] Class: Historical Med  . Order #: [id number] Class: Historical Med  . Order #: [id number] Class: Historical Med  . Order #: [id number] Class: Print  . Order #: [id number] Class: Historical Med  . Order #: [id number] Class: Historical Med    Allergies Shrimp [shellfish allergy]; Tuna [fish allergy]; Amoxicillin-pot clavulanate; Aspirin; Feldene  [piroxicam]; Food; Levofloxacin; Lodine [etodolac]; Latex; and Neosporin [neomycin-bacitracin zn-polymyx]  No family history on file.  Social History Social History   Tobacco Use  . Smoking status: Never Smoker  . Smokeless tobacco: Never Used  Substance Use Topics  . Alcohol use: No  . Drug use: No    Review of Systems Constitutional: No fever/chills.  No lightheadedness or syncope.  Positive mechanical fall.  No loss of consciousness. Eyes: No visual changes. ENT: No congestion or rhinorrhea. Cardiovascular: Denies chest pain. Denies palpitations. Respiratory: Denies shortness of breath.  No cough. Gastrointestinal:  No nausea, no vomiting.   Genitourinary: Negative for dysuria. Musculoskeletal: Negative for back pain.  Negative for neck pain.  Positive for right elbow pain.  Negative for right wrist pain.  No pain in the pelvis or lower extremities with walking. Skin: Negative for rash. Neurological: Negative for headaches. No focal numbness, tingling or weakness.     ____________________________________________   PHYSICAL EXAM:  VITAL SIGNS: ED Triage Vitals  Enc Vitals Group     BP 08/30/17 0730 (!) 163/68     Pulse Rate 08/30/17 0730 84     Resp --      Temp --      Temp src --      SpO2 08/30/17 0730 (!) 88 %     Weight 08/30/17 0633 135 lb (61.2 kg)     Height 08/30/17 0633 5\' 4"  (1.626 m)     Head Circumference --  Peak Flow --      Pain Score 08/30/17 0633 10     Pain Loc --      Pain Edu? --      Excl. in GC? --     Constitutional: Alert and oriented. Well appearing and in no acute distress. Answers questions appropriately. Eyes: Conjunctivae are normal.  EOMI. No scleral icterus. Head: Atraumatic. Nose: No congestion/rhinnorhea. Mouth/Throat: Mucous membranes are moist.  Neck: No stridor.  Supple.  No midline C-spine tenderness to palpation, step-offs or deformities. Cardiovascular: Normal rate, regular rhythm. No murmurs, rubs or gallops.   Respiratory: Normal respiratory effort.  No accessory muscle use or retractions. Lungs CTAB.  No wheezes, rales or ronchi. Musculoskeletal: Pelvis is stable.  Full range of motion of the bilateral hips, knees and ankles without pain.  Full range of motion of the bilateral wrists, left elbow, and left shoulder without pain.  The right elbow has a frank deformity with protrusion posteriorly and inability to completely straighten the arm.  The skin is intact.  Normal right radial pulse.  Normal sensation to light touch.  5 out of 5 right grip strength. Neurologic:  A&Ox3.  Speech is clear.  Face and smile are symmetric.  EOMI.   Skin:  Skin is warm, dry and intact. No rash noted. Psychiatric: Mood and affect are normal. Speech and behavior are normal.  Normal judgement.  ____________________________________________   LABS (all labs ordered are listed, but only abnormal results are displayed)  Labs Reviewed - No data to display ____________________________________________  EKG  Not Indicated ____________________________________________  RADIOLOGY  Dg Elbow Complete Right  Result Date: 08/30/2017 CLINICAL DATA:  Fall while walking dog. Right elbow pain and swelling. Initial encounter. EXAM: RIGHT ELBOW - COMPLETE 3+ VIEW COMPARISON:  None. FINDINGS: There is posterior and lateral dislocation of the radius and ulna relative to the humerus. No definite fracture is identified. Bone mineralization appears normal. No focal soft tissue abnormality is seen. IMPRESSION: Posterior elbow dislocation without fracture identified. Electronically Signed   By: Sebastian Ache M.D.   On: 08/30/2017 07:22    ____________________________________________   PROCEDURES  Procedure(s) performed: None  .Sedation Date/Time: 08/30/2017 7:47 AM Performed by: Rockne Menghini, MD Authorized by: Rockne Menghini, MD   Consent:    Consent obtained:  Written (electronic informed consent)   Risks  discussed:  Allergic reaction, dysrhythmia, inadequate sedation, nausea, vomiting, respiratory compromise necessitating ventilatory assistance and intubation, prolonged sedation necessitating reversal and prolonged hypoxia resulting in organ damage Universal protocol:    Procedure explained and questions answered to patient or proxy's satisfaction: yes     Relevant documents present and verified: yes     Test results available and properly labeled: yes     Imaging studies available: yes     Required blood products, implants, devices, and special equipment available: yes     Immediately prior to procedure a time out was called: yes     Patient identity confirmation method:  Arm band Indications:    Procedure performed:  Dislocation reduction   Procedure necessitating sedation performed by:  Physician performing sedation   Intended level of sedation:  Moderate (conscious sedation) Pre-sedation assessment:    Time since last food or drink:  Yesterday   ASA classification: class 2 - patient with mild systemic disease     Neck mobility: normal     Mallampati score:  II - soft palate, uvula, fauces visible   Pre-sedation assessments completed and reviewed: airway patency, cardiovascular function,  hydration status, mental status, nausea/vomiting, pain level, respiratory function and temperature     Pre-sedation assessment completed:  08/30/2017 7:30 AM Immediate pre-procedure details:    Reassessment: Patient reassessed immediately prior to procedure     Reviewed: vital signs, relevant labs/tests and NPO status     Verified: bag valve mask available, emergency equipment available, intubation equipment available, IV patency confirmed, oxygen available, reversal medications available and suction available   Procedure details (see MAR for exact dosages):    Preoxygenation:  Nasal cannula   Sedation:  Etomidate   Analgesia:  Fentanyl   Intra-procedure monitoring:  Blood pressure monitoring,  continuous pulse oximetry, cardiac monitor, frequent vital sign checks and frequent LOC assessments   Intra-procedure events: none     Intra-procedure management:  Supplemental oxygen   Total Provider sedation time (minutes):  8 Post-procedure details:    Post-sedation assessment completed:  08/30/2017 8:09 AM   Attendance: Constant attendance by certified staff until patient recovered     Recovery: Patient returned to pre-procedure baseline     Post-sedation assessments completed and reviewed: airway patency, cardiovascular function, hydration status, mental status, nausea/vomiting, pain level and respiratory function     Patient is stable for discharge or admission: yes     Patient tolerance:  Tolerated well, no immediate complications Comments:     0.1mg /kg of etomidate with 12.405mcg of fentanyl used for sedation. Marland Kitchen.Splint Application Date/Time: 08/30/2017 7:48 AM Performed by: Rockne MenghiniNorman, Anne-Caroline, MD Authorized by: Rockne MenghiniNorman, Anne-Caroline, MD   Consent:    Consent obtained:  Verbal and written   Consent given by:  Patient   Risks discussed:  Numbness, pain and swelling   Alternatives discussed:  Delayed treatment Pre-procedure details:    Sensation:  Normal Procedure details:    Laterality:  Right   Location:  Elbow   Elbow:  R elbow   Splint type:  Long arm   Supplies:  Ortho-Glass Post-procedure details:    Pain:  Improved   Sensation:  Normal   Patient tolerance of procedure:  Tolerated well, no immediate complications Comments:     Normal radial pulse prior to application and cap refill <2 sec after application.  Splint applied by myself and ED tech. Reduction of dislocation Date/Time: 08/30/2017 7:49 AM Performed by: Rockne MenghiniNorman, Anne-Caroline, MD Authorized by: Rockne MenghiniNorman, Anne-Caroline, MD  Consent: Verbal consent obtained. Written consent obtained. Risks and benefits: risks, benefits and alternatives were discussed Consent given by: patient Patient understanding: patient  states understanding of the procedure being performed Patient consent: the patient's understanding of the procedure matches consent given Procedure consent: procedure consent matches procedure scheduled Relevant documents: relevant documents present and verified Test results: test results available and properly labeled Imaging studies: imaging studies available Patient identity confirmed: verbally with patient and arm band Time out: Immediately prior to procedure a "time out" was called to verify the correct patient, procedure, equipment, support staff and site/side marked as required. Local anesthesia used: no  Anesthesia: Local anesthesia used: no  Sedation: Patient sedated: yes Sedation type: moderate (conscious) sedation Sedatives: etomidate Analgesia: fentanyl Sedation start date/time: 08/30/2017 7:54 AM Sedation end date/time: 08/30/2017 8:02 AM Vitals: Vital signs were monitored during sedation.  Patient tolerance: Patient tolerated the procedure well with no immediate complications Comments: Traction/Counter traction technique applied with clear reduction clinically; pt was neurovascularly intact after procedure and splint was applied.     Critical Care performed: No ____________________________________________   INITIAL IMPRESSION / ASSESSMENT AND PLAN / ED COURSE  Pertinent labs & imaging  results that were available during my care of the patient were reviewed by me and considered in my medical decision making (see chart for details).  61 y.o. female with a mechanical fall, resulting in right elbow pain and deformity.  Overall, the patient is hemodynamically stable.  Her clinical examination is most consistent with a posterior elbow dislocation.  Her x-ray also confirms this and at the initial injury she has no fractures.  I do not suspect any other acute injuries based on my reassuring examination.  She is neurovascularly intact.  We have had a long discussion about the  risks and benefits of moderate sedation and elbow reduction.  The patient has agreed and signed consent paperwork.  ----------------------------------------- 8:10 AM on 08/30/2017 -----------------------------------------  The patient tolerated moderate sedation, reduction and splint placement well.  She is now slightly groggy but answering questions appropriately with stable vital signs.  She is undergoing postreduction x-ray, and will be given verbal and written instructions about splint use and orthopedic follow-up.  Plan discharge.  ____________________________________________  FINAL CLINICAL IMPRESSION(S) / ED DIAGNOSES  Final diagnoses:  Closed posterior dislocation of right elbow, initial encounter  Fall, initial encounter         NEW MEDICATIONS STARTED DURING THIS VISIT:  This SmartLink is deprecated. Use AVSMEDLIST instead to display the medication list for a patient.    Rockne Menghini, MD 08/30/17 [id number]    Rockne Menghini, MD 08/30/17 [phone]

## 2017-09-04 ENCOUNTER — Encounter: Payer: Self-pay | Admitting: Podiatry

## 2017-09-04 ENCOUNTER — Ambulatory Visit: Payer: BC Managed Care – PPO | Admitting: Podiatry

## 2017-09-04 VITALS — BP 133/87 | HR 91 | Resp 16

## 2017-09-04 DIAGNOSIS — L600 Ingrowing nail: Secondary | ICD-10-CM

## 2017-09-04 DIAGNOSIS — L603 Nail dystrophy: Secondary | ICD-10-CM

## 2017-09-04 MED ORDER — MUPIROCIN 2 % EX OINT: TOPICAL_OINTMENT | CUTANEOUS | 1 refills | Status: DC

## 2017-09-04 NOTE — Patient Instructions (Signed)

## 2017-09-04 NOTE — Progress Notes (Signed)
Subjective:  Patient ID: Monique Kirby, female    DOB: 09-25-1955,  MRN: [id number] HPI Chief Complaint  Patient presents with  . Nail Problem    (1) Hallux left - lateral border, ingrown, thick and dark (2) Hallux right - thick and dark nail (3) 5th toe/nail right - callused area and toenail is thick, brittle and dark (4) Medial foot-callused, rough area  -  PCP tried trimming the ingrown out but came back and was concerned she had a fungus as well    61 y.o. female presents with the above complaint.     Past Medical History:  Diagnosis Date  . Asthma   . Headache(784.0)    migranes  . Hypertension   . Rash Tuesday pm 09/26/11   left upper leg ...    Past Surgical History:  Procedure Laterality Date  . COLONOSCOPY    . DILATION AND CURETTAGE OF UTERUS    . DISTAL ULNA EXCISION    . ENDOMETRIAL ABLATION    . HEEL SPUR SURGERY    . JOINT REPLACEMENT  02/14/2011   knee rt  . KNEE ARTHROSCOPY  2012  . LUMBAR LAMINECTOMY/DECOMPRESSION MICRODISCECTOMY  09/28/2011   Procedure: LUMBAR LAMINECTOMY/DECOMPRESSION MICRODISCECTOMY;  Surgeon: Alvy Bealahari D Brooks;  Location: [facility];  Service: Orthopedics;  Laterality: Right;  RIGHT L3-4 DISCECTOMY  . WRIST SURGERY      Current Outpatient Medications:  .  B Complex-C (B-COMPLEX WITH VITAMIN C) tablet, Take 1 tablet by mouth daily.  , Disp: , Rfl:  .  oxyCODONE (OXY IR/ROXICODONE) 5 MG immediate release tablet, TAKE ONE TABLET BY MOUTH EVERY 4 HOURS AS NEEDED FOR SEVERE PAIN, Disp: , Rfl: 0 .  valsartan-hydrochlorothiazide (DIOVAN-HCT) 160-25 MG per tablet, Take 1 tablet by mouth daily.  , Disp: , Rfl:   Allergies  Allergen Reactions  . Shrimp [Shellfish Allergy] Anaphylaxis  . Tuna [Fish Allergy] Anaphylaxis and Swelling  . Amoxicillin-Pot Clavulanate   . Aspirin Other (See Comments)    Makes pt sick  . Feldene [Piroxicam] Swelling  . Food Other (See Comments)    Zucchini- mouth swells  . Levofloxacin Hives  . Levofloxacin   .  Lodine [Etodolac] Hives  . Neosporin Plus [address]   . Tape   . Latex Itching and Rash  . Neosporin [Neomycin-Bacitracin Zn-Polymyx] Rash   Review of Systems  Constitutional: Positive for activity change.  Musculoskeletal: Positive for arthralgias.  All other systems reviewed and are negative.  Objective:   Vitals:   09/04/17 1333  BP: 133/87  Pulse: 91  Resp: 16    General: Well developed, nourished, in no acute distress, alert and oriented x3   Dermatological: Skin is warm, dry and supple bilateral. Nails x 10 are well maintained; remaining integument appears unremarkable at this time. There are no open sores, no preulcerative lesions, no rash or signs of infection present.  Sharp and rated now more swollen tibial border the hallux right inferior border of the hallux left especially painful palpation moderate erythema no purulence no malodor.  Ingrown nail was noted.  Left is worse than right.  Vascular: Dorsalis Pedis artery and Posterior Tibial artery pedal pulses are 2/4 bilateral with immedate capillary fill time. Pedal hair growth present. No varicosities and no lower extremity edema present bilateral.   Neruologic: Grossly intact via light touch bilateral. Vibratory intact via tuning fork bilateral. Protective threshold with Semmes Wienstein monofilament intact to all pedal sites bilateral. Patellar and Achilles deep tendon reflexes 2+ bilateral. No  Babinski or clonus noted bilateral.   Musculoskeletal: No gross boney pedal deformities bilateral. No pain, crepitus, or limitation noted with foot and ankle range of motion bilateral. Muscular strength 5/5 in all groups tested bilateral.  Gait: Unassisted, Nonantalgic.    Radiographs:  No x-rays were taken.  Assessment & Plan:   Assessment: Ingrown toenail tibial border hallux right fibular border hallux left.  Plan: We discussed the etiology pathology conservative versus surgical therapies.  At this point after verbal  consent we localized each toe with Marcaine and Xylocaine mix.  There is prepped draped sterile sterile fashion.  Chemical matrixectomy's were performed she tolerated procedure well she was provided with both oral and written home-going instructions for the care and soaking of her toes as well as a prescription for mupirocin to be applied twice daily after soaking in Epsom salts and warm water.  Follow-up with her in 1-2 weeks.     Monique Kirby, [place]

## 2017-09-20 ENCOUNTER — Encounter: Payer: Self-pay | Admitting: Podiatry

## 2017-09-20 ENCOUNTER — Ambulatory Visit: Payer: BC Managed Care – PPO | Admitting: Podiatry

## 2017-09-20 DIAGNOSIS — Z79899 Other long term (current) drug therapy: Secondary | ICD-10-CM

## 2017-09-20 DIAGNOSIS — L603 Nail dystrophy: Secondary | ICD-10-CM

## 2017-09-20 DIAGNOSIS — L600 Ingrowing nail: Secondary | ICD-10-CM

## 2017-09-22 NOTE — Progress Notes (Signed)
She presents today for follow-up of nail avulsions/matrixectomy's she states that they are a little bit sore but appear to be doing well.  Continues to soak in twice daily Epsom salts and warm water.  She continues to apply Cortisporin Otic.  She would also like to have the results of her pathology from her toenail biopsy.  Objective: Vital signs are stable she is alert and oriented x3 toenails appear to be healing very nicely there is no signs of infection either hallux nail plate.  They both look like they are doing very well no purulence no malodor.  There is minimal tenderness on palpation.  Pathology report does demonstrate onychomycosis.  Assessment: Well-healing matrixectomy hallux bilateral.  Onychomycosis bilateral foot.  Plan: She will continue to soak in Epsom salts warm water every other day until this is completely healed.  She has no pain she has no redness no drainage otherwise she will notify us in 2 weeks.  We discussed different therapies today for onychomycosis and at this point she would like to pursue laser therapy with Shanda BumpsJessica.  She will follow-up with Shanda BumpsJessica in the near future.

## 2017-10-26 ENCOUNTER — Ambulatory Visit (INDEPENDENT_AMBULATORY_CARE_PROVIDER_SITE_OTHER): Payer: BC Managed Care – PPO

## 2017-10-26 DIAGNOSIS — B351 Tinea unguium: Secondary | ICD-10-CM

## 2017-11-23 ENCOUNTER — Other Ambulatory Visit: Payer: BC Managed Care – PPO

## 2017-12-14 ENCOUNTER — Ambulatory Visit (INDEPENDENT_AMBULATORY_CARE_PROVIDER_SITE_OTHER): Payer: BC Managed Care – PPO

## 2017-12-14 DIAGNOSIS — B351 Tinea unguium: Secondary | ICD-10-CM

## 2017-12-14 DIAGNOSIS — L603 Nail dystrophy: Secondary | ICD-10-CM

## 2017-12-26 NOTE — Progress Notes (Signed)
Pt presents with mycotic infection of nails 1-5 bilateral  All other systems are negative  Laser therapy administered to affected nails and tolerated well. All safety precautions were in place. Re-appointed in 4 weeks for 3rd treatment 

## 2018-01-11 ENCOUNTER — Ambulatory Visit: Payer: Self-pay

## 2018-01-11 DIAGNOSIS — B351 Tinea unguium: Secondary | ICD-10-CM

## 2018-01-11 DIAGNOSIS — L603 Nail dystrophy: Secondary | ICD-10-CM

## 2018-01-16 NOTE — Progress Notes (Signed)
Pt presents with mycotic infection of nails 1-5 bilateral  All other systems are negative  Laser therapy administered to affected nails and tolerated well. All safety precautions were in place. Re-appointed in 4 weeks for 4th treatment 

## 2018-02-15 ENCOUNTER — Ambulatory Visit: Payer: BC Managed Care – PPO

## 2018-02-15 DIAGNOSIS — B351 Tinea unguium: Secondary | ICD-10-CM

## 2018-02-15 DIAGNOSIS — L603 Nail dystrophy: Secondary | ICD-10-CM

## 2018-02-26 NOTE — Progress Notes (Signed)
Pt presents with mycotic infection of nails 1-5 bilateral   All other systems are negative  Laser therapy administered to affected nails and tolerated well. All safety precautions were in place. Re-appointed in 4 weeks for 5th treatment 

## 2018-03-15 ENCOUNTER — Other Ambulatory Visit: Payer: BC Managed Care – PPO

## 2018-04-16 ENCOUNTER — Ambulatory Visit
Admission: RE | Admit: 2018-04-16 | Discharge: 2018-04-16 | Disposition: A | Discharge status: Home / Self Care | Payer: BC Managed Care – PPO | Source: Ambulatory Visit | Attending: Internal Medicine | Admitting: Internal Medicine

## 2018-04-16 ENCOUNTER — Other Ambulatory Visit: Payer: Self-pay | Admitting: Internal Medicine

## 2018-04-16 DIAGNOSIS — R519 Headache, unspecified: Secondary | ICD-10-CM

## 2018-04-16 DIAGNOSIS — T148XXA Other injury of unspecified body region, initial encounter: Secondary | ICD-10-CM

## 2018-04-16 DIAGNOSIS — R05 Cough: Secondary | ICD-10-CM

## 2018-04-16 DIAGNOSIS — R52 Pain, unspecified: Secondary | ICD-10-CM

## 2018-04-16 DIAGNOSIS — R059 Cough, unspecified: Secondary | ICD-10-CM

## 2018-04-16 DIAGNOSIS — R51 Headache: Principal | ICD-10-CM

## 2018-04-19 ENCOUNTER — Other Ambulatory Visit: Payer: BC Managed Care – PPO

## 2018-06-17 ENCOUNTER — Other Ambulatory Visit: Payer: BC Managed Care – PPO

## 2018-10-08 IMAGING — CR DG SINUSES COMPLETE 3+V
4 series · 4 of 4 positions shown · non-contrast
Comparison: None.

CLINICAL DATA: Sinus drainage, cough.

EXAM:
PARANASAL SINUSES - COMPLETE 3 + VIEW

[[person_name] pa]
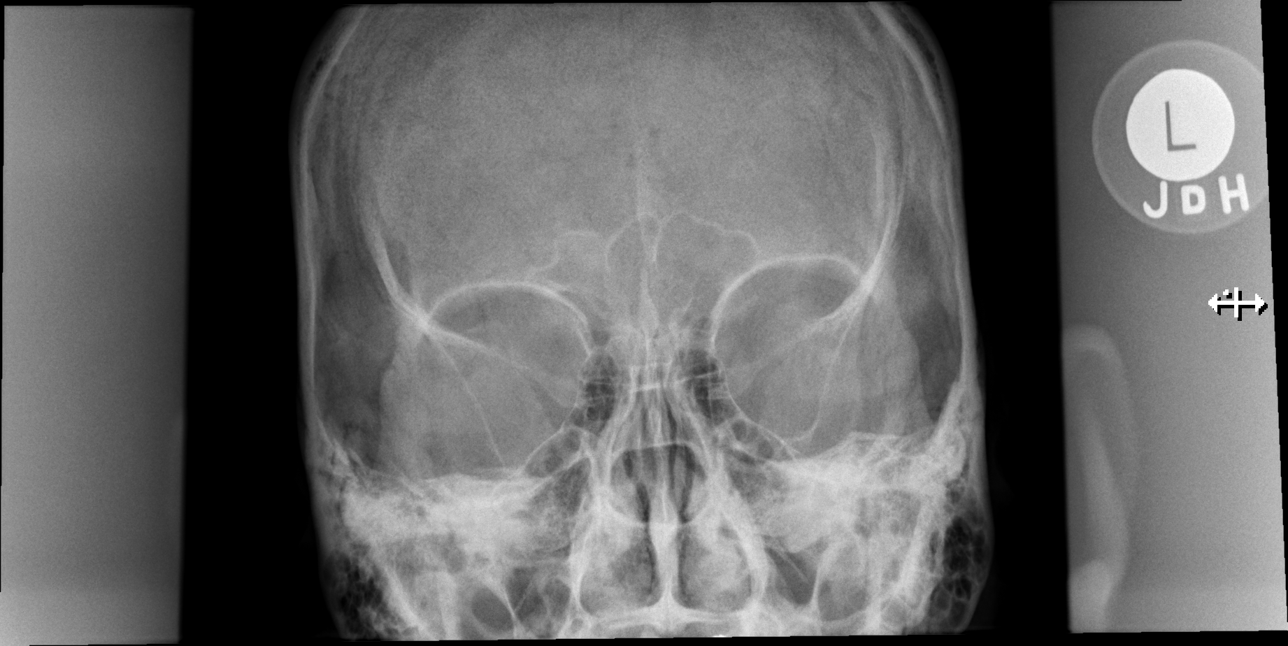

[w waters pa]
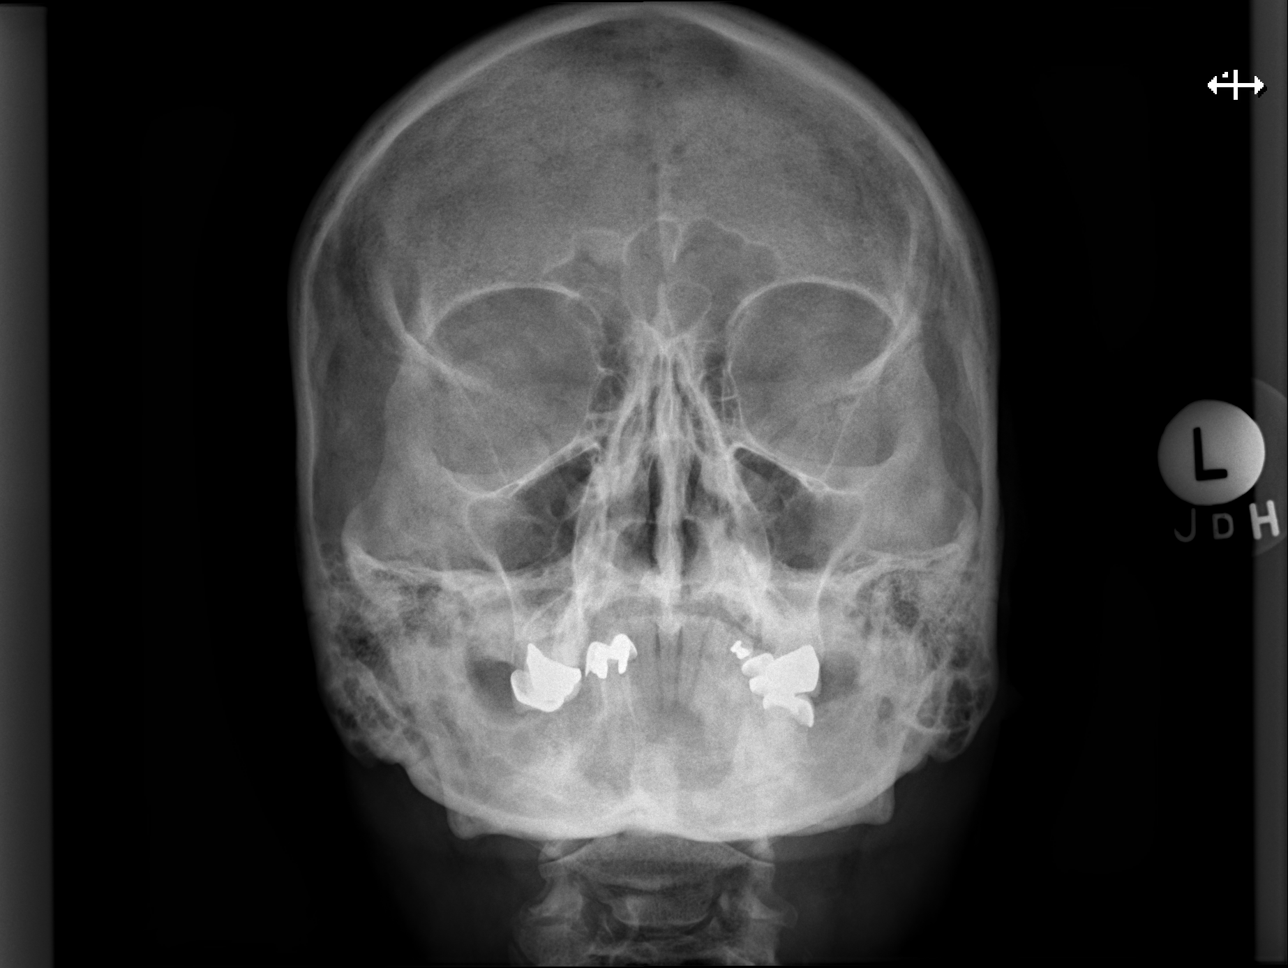

[w skull lat]
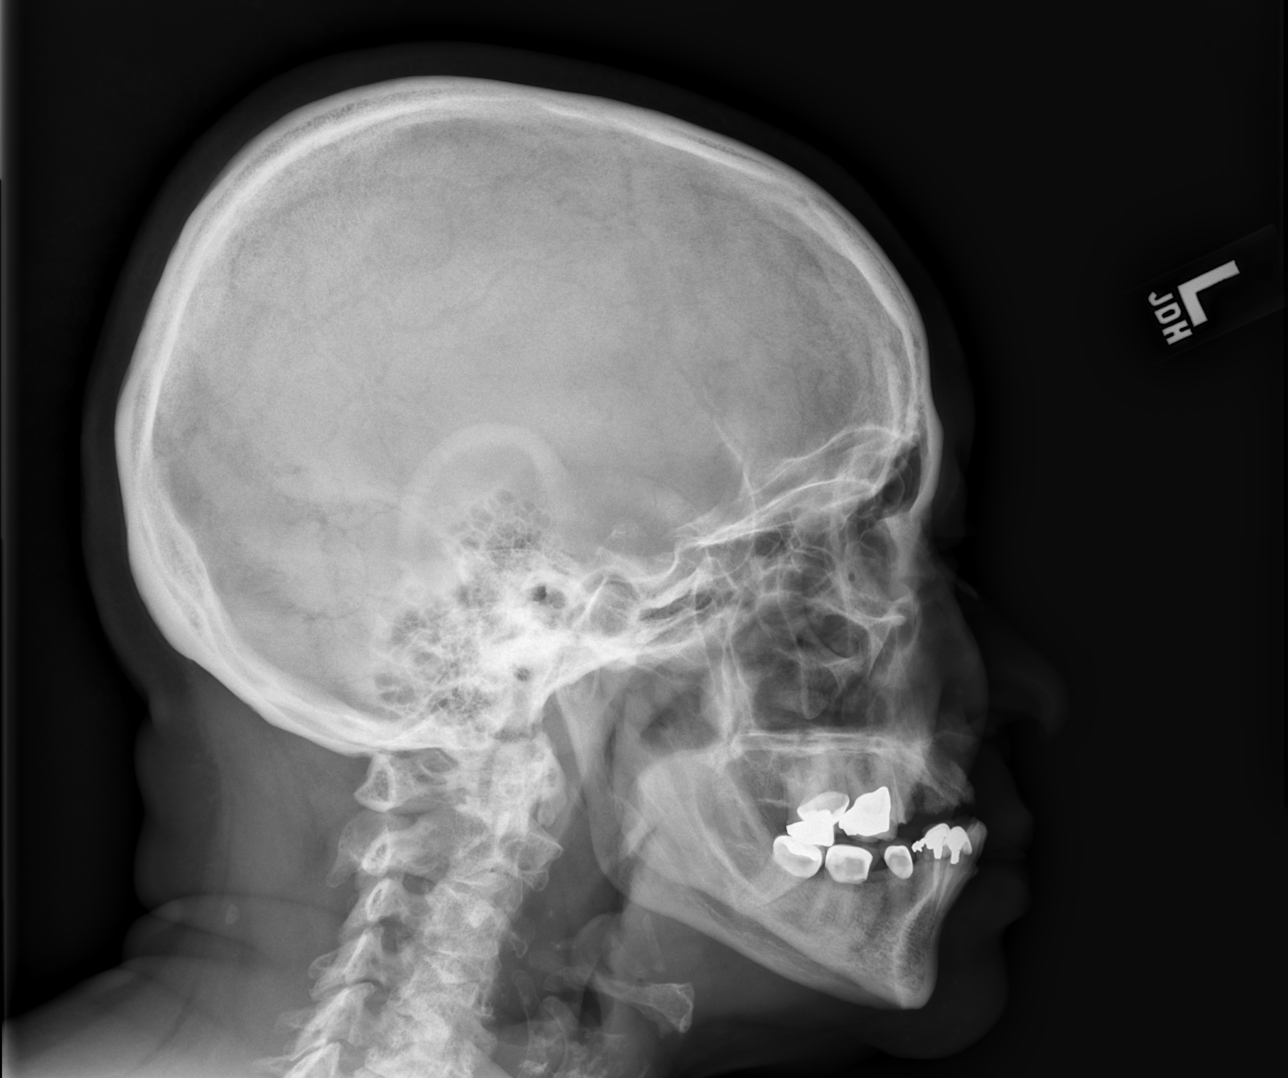

[w smv]
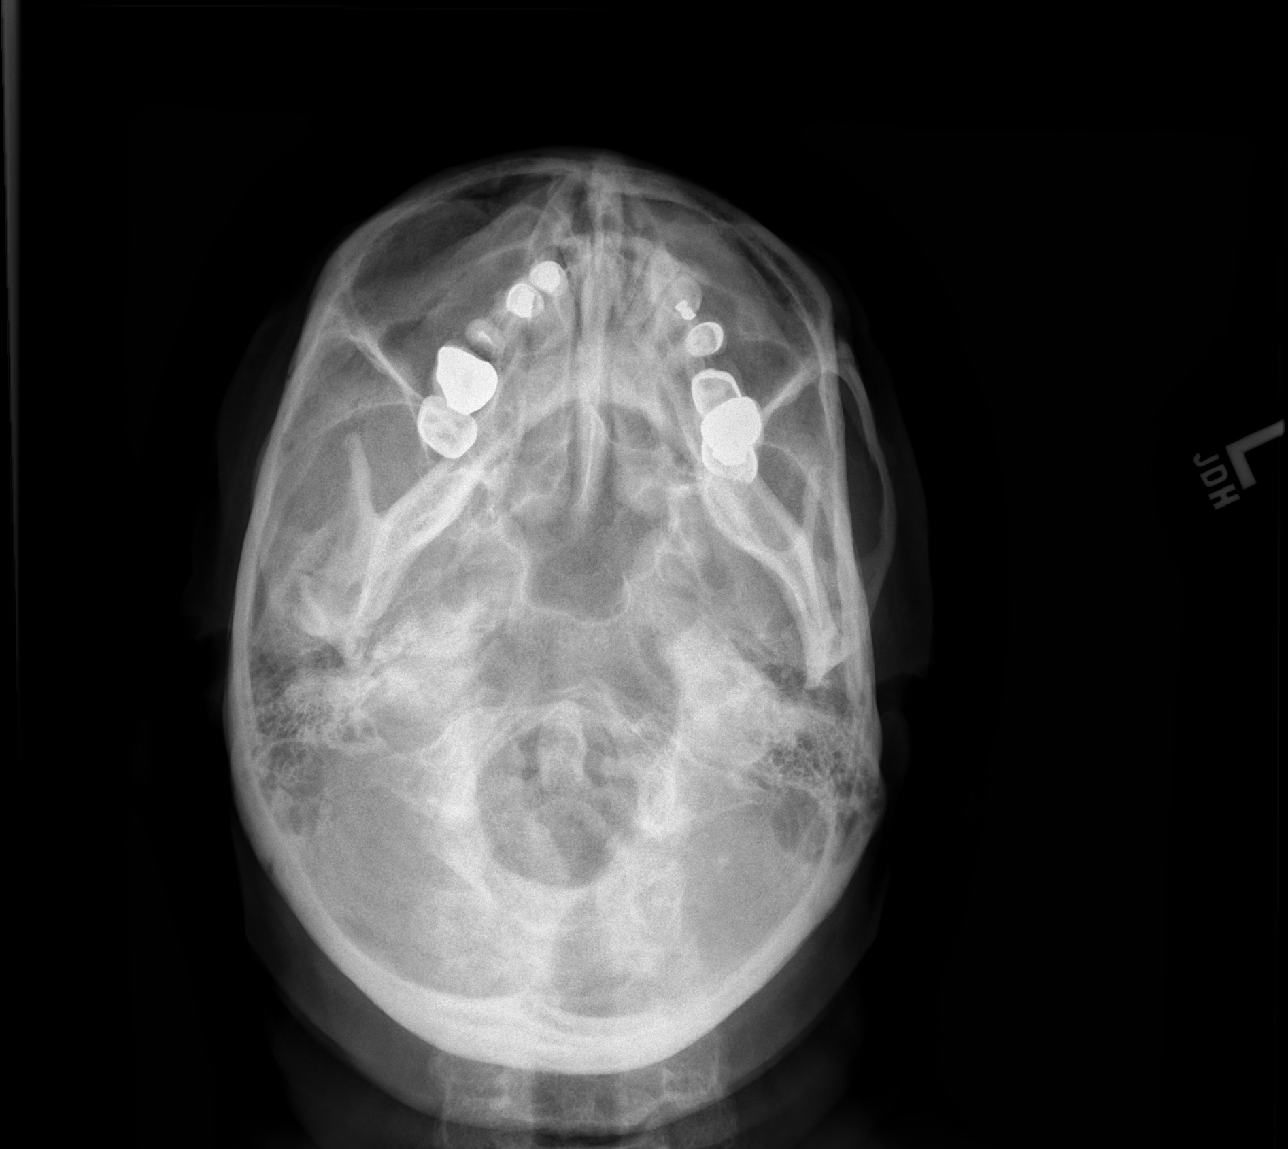

[4 of 4 positions shown; findings below may reference images not displayed]

FINDINGS: The paranasal sinus are aerated. There is no evidence of sinus
opacification air-fluid levels or mucosal thickening. No significant
bone abnormalities are seen.
IMPRESSION: No definite evidence of sinusitis.

## 2020-10-21 ENCOUNTER — Telehealth: Payer: Self-pay | Admitting: Podiatry

## 2020-10-21 NOTE — Telephone Encounter (Signed)
Pt called requesting pain meds. I left her a voice message stating she hasn't been seen with [place] since 2019, and had to make an appt to see a provider first.

## 2021-02-10 ENCOUNTER — Ambulatory Visit: Payer: Self-pay | Admitting: Cardiology

## 2021-03-04 ENCOUNTER — Ambulatory Visit: Payer: Self-pay | Admitting: Cardiology

## 2021-03-31 NOTE — Progress Notes (Signed)
Date:  04/01/2021   ID:  Monique Kirby, DOB [birth year], MRN [id number]  PCP:  No primary care provider on file.  Cardiologist:  Rex Kras, DO, [facility] (established care 04/01/2021)  REASON FOR CONSULT: Dyspnea on exertion  REQUESTING PHYSICIAN:  Levin Erp, MD [address] [place],  [place] [address]  Chief Complaint  Patient presents with   Dyspnea on exertion   New Patient (Initial Visit)    HPI  Monique Kirby is a 65 y.o. female who presents to the office with a chief complaint of "shortness of breath and abnormal EKG." Patient's past medical history and cardiovascular risk factors include: HTN, asthma,   She is referred to the office at the request of Levin Erp, MD for evaluation of dyspnea on exertion.  Patient recently has been having asthma flareups which causes her to cough excessively and as result was having chest pain.  She had gone to a local urgent care and was given muscle relaxants and the symptoms of chest pain have resolved.  However she has been experiencing shortness of breath more with effort related activities due to her underlying asthma and the current weather being hot and humid.  The EKG at the urgent care was also interpreted to be abnormal and is referred to cardiology for further evaluation and management.  At the current time patient denies any active chest pain at rest or with effort related activities.  Her blood pressures are usually elevated when she sees providers but states that at home they may be better controlled.  She does not check them on a regular basis but is motivated to do so.  FUNCTIONAL STATUS: Limited in hot and humid weather due to her asthma.    ALLERGIES: Allergies  Allergen Reactions   Shrimp [Shellfish Allergy] Anaphylaxis   Geralyn Flash [Fish Allergy] Anaphylaxis and Swelling   Amoxicillin-Pot Clavulanate    Aspirin Other (See Comments)    Makes pt sick   Feldene [Piroxicam] Swelling   Food Other (See  Comments)    Zucchini- mouth swells   Levofloxacin Hives   Levofloxacin    Lodine [Etodolac] Hives   Neosporin Plus Max [address]    Tape    Latex Itching and Rash   Neosporin [Neomycin-Bacitracin Zn-Polymyx] Rash    MEDICATION LIST PRIOR TO VISIT: Current Meds  Medication Sig   albuterol (VENTOLIN HFA) 108 (90 Base) MCG/ACT inhaler Inhale 1-2 puffs into the lungs every 4 (four) hours as needed.   fexofenadine (ALLEGRA) 180 MG tablet Take 180 mg by mouth daily.   fluticasone (FLONASE) 50 MCG/ACT nasal spray Place into both nostrils daily.   fluticasone (FLOVENT HFA) 110 MCG/ACT inhaler Inhale into the lungs 2 (two) times daily.   hydrochlorothiazide (HYDRODIURIL) 25 MG tablet Take 1 tablet (25 mg total) by mouth every morning.   latanoprost (XALATAN) 0.005 % ophthalmic solution Place 1 drop into both eyes daily at 12 noon.   metoprolol succinate (TOPROL-XL) 50 MG 24 hr tablet Take 50 mg by mouth daily.   valsartan (DIOVAN) 320 MG tablet Take 1 tablet (320 mg total) by mouth every evening.   [DISCONTINUED] valsartan-hydrochlorothiazide (DIOVAN-HCT) 160-25 MG per tablet Take 1 tablet by mouth daily.       PAST MEDICAL HISTORY: Past Medical History:  Diagnosis Date   Asthma    Headache(784.0)    migranes   Hypertension    Rash Tuesday pm 09/26/11   left upper leg ...     PAST SURGICAL HISTORY:  Past Surgical History:  Procedure Laterality Date   COLONOSCOPY     DILATION AND CURETTAGE OF UTERUS     DISTAL ULNA EXCISION     ENDOMETRIAL ABLATION     HEEL SPUR SURGERY     JOINT REPLACEMENT  02/14/2011   knee rt   KNEE ARTHROSCOPY  2012   LUMBAR LAMINECTOMY/DECOMPRESSION MICRODISCECTOMY  09/28/2011   Procedure: LUMBAR LAMINECTOMY/DECOMPRESSION MICRODISCECTOMY;  Surgeon: Dahlia Bailiff;  Location: [place];  Service: Orthopedics;  Laterality: Right;  RIGHT L3-4 DISCECTOMY   WRIST SURGERY      FAMILY HISTORY: The patient family history includes Congestive Heart Failure in her brother;  Hypertension in her brother.  SOCIAL HISTORY:  The patient  reports that she has never smoked. She has never used smokeless tobacco. She reports that she does not drink alcohol and does not use drugs.  REVIEW OF SYSTEMS: Review of Systems  Constitutional: Negative for chills and fever.  HENT:  Negative for hoarse voice and nosebleeds.   Eyes:  Negative for discharge, double vision and pain.  Cardiovascular:  Positive for dyspnea on exertion. Negative for chest pain, claudication, leg swelling, near-syncope, orthopnea, palpitations, paroxysmal nocturnal dyspnea and syncope.  Respiratory:  Negative for hemoptysis and shortness of breath.   Musculoskeletal:  Negative for muscle cramps and myalgias.  Gastrointestinal:  Negative for abdominal pain, constipation, diarrhea, hematemesis, hematochezia, melena, nausea and vomiting.  Neurological:  Negative for dizziness and light-headedness.   PHYSICAL EXAM: Vitals with BMI 04/01/2021 04/01/2021 09/04/2017  Height - 5' 4"  -  Weight - 138 lbs -  BMI - 83.41 -  Systolic 962 229 798  Diastolic 94 921 87  Pulse 87 96 91    CONSTITUTIONAL: Well-developed and well-nourished. No acute distress.  SKIN: Skin is warm and dry. No rash noted. No cyanosis. No pallor. No jaundice HEAD: Normocephalic and atraumatic.  EYES: No scleral icterus MOUTH/THROAT: Moist oral membranes.  NECK: No JVD present. No thyromegaly noted. No carotid bruits  LYMPHATIC: No visible cervical adenopathy.  CHEST Normal respiratory effort. No intercostal retractions  LUNGS: Clear to auscultation bilaterally.  No stridor. No wheezes. No rales.  CARDIOVASCULAR: Regular, positive S1-S2, no murmurs rubs or gallops appreciated ABDOMINAL: Soft, nontender, nondistended, positive bowel sounds in all 4 quadrants, no apparent ascites.  EXTREMITIES: No peripheral edema  HEMATOLOGIC: No significant bruising NEUROLOGIC: Oriented to person, place, and time. Nonfocal. Normal muscle tone.   PSYCHIATRIC: Normal mood and affect. Normal behavior. Cooperative  CARDIAC DATABASE: EKG: 04/01/2021: Normal sinus rhythm, 83 bpm, ST-T changes in the high lateral and lateral leads, without underlying injury pattern.   Echocardiogram: No results found for this or any previous visit from the past 1095 days.   Stress Testing: No results found for this or any previous visit from the past 1095 days.  Heart Catheterization: None  LABORATORY DATA: CBC Latest Ref Rng & Units 09/25/2011 02/17/2011 02/16/2011  WBC 4.0 - 10.5 K/uL 7.6 8.3 9.8  Hemoglobin 12.0 - 15.0 g/dL 13.7 11.3(L) 12.3  Hematocrit 36.0 - 46.0 % 40.2 34.1(L) 36.7  Platelets 150 - 400 K/uL 342 301 278    CMP Latest Ref Rng & Units 09/25/2011 02/17/2011 02/16/2011  Glucose 70 - 99 mg/dL 115(H) 126(H) 127(H)  BUN 6 - 23 mg/dL 13 14 6   Creatinine 0.50 - 1.10 mg/dL 0.54 0.86 0.61  Sodium 135 - 145 mEq/L 140 126(L) 128(L)  Potassium 3.5 - 5.1 mEq/L 3.7 3.9 3.7  Chloride 96 - 112 mEq/L 97 86(L) 89 DELTA  CHECK NOTED(L)  CO2 19 - 32 mEq/L 30 33(H) 32  Calcium 8.4 - 10.5 mg/dL 10.7(H) 9.3 9.7  Total Protein 6.0 - 8.3 g/dL - - -  Total Bilirubin 0.3 - 1.2 mg/dL - - -  Alkaline Phos 39 - 117 U/L - - -  AST 0 - 37 U/L - - -  ALT 0 - 35 U/L - - -    Lipid Panel  No results found for: CHOL, TRIG, HDL, CHOLHDL, VLDL, LDLCALC, LDLDIRECT, LABVLDL  No components found for: NTPROBNP No results for input(s): PROBNP in the last 8760 hours. No results for input(s): TSH in the last 8760 hours.  BMP No results for input(s): NA, K, CL, CO2, GLUCOSE, BUN, CREATININE, CALCIUM, GFRNONAA, GFRAA in the last 8760 hours.  HEMOGLOBIN A1C No results found for: HGBA1C, MPG  External Labs:  Date Collected: 01/11/2021 , information obtained by referring physician Potassium: 3.6 Creatinine 0.59 mg/dL. eGFR: 111 mL/min per 1.73 m Hemoglobin: 13.0 g/dL and hematocrit: 40.2 % AST: 16 , ALT: 11 , alkaline phosphatase: 49  D-dimers 0.36 (within  normal limits)  IMPRESSION:    ICD-10-CM   1. Dyspnea on exertion  R06.00 EKG 12-Lead    PCV ECHOCARDIOGRAM COMPLETE    PCV MYOCARDIAL PERFUSION WO LEXISCAN    2. Benign hypertension  I10 Pro b natriuretic peptide (BNP)    Basic metabolic panel    valsartan (DIOVAN) 320 MG tablet    hydrochlorothiazide (HYDRODIURIL) 25 MG tablet    Magnesium    3. Nonspecific abnormal electrocardiogram (ECG) (EKG)  R94.31        RECOMMENDATIONS: OYUKI HOGAN is a 65 y.o. female whose past medical history and cardiac risk factors include: HTN, asthma.   Dyspnea on exertion: I suspect is most likely secondary to uncontrolled hypertension; however, given her EKG findings would benefit from additional evaluation from a cardiovascular standpoint. Echocardiogram will be ordered to evaluate for structural heart disease and left ventricular systolic function. Exercise nuclear stress test to evaluate for functional status and reversible ischemia. Check BMP  Benign essential hypertension: Discontinue valsartan/hydrochlorothiazide. Increase valsartan from 160 to 320 mg p.o. every afternoon Patient is asked to take her hydrochlorothiazide and metoprolol in the morning Blood work in 1 week to evaluate kidney function and electrolytes.  Asthma: Currently follows up with pulmonary medicine.  Patient is asked to follow-up/establish care with primary care physician as patient informs Dr. Nyoka Cowden has retired.  Patient is asked to bring in her complete blood work at the next office visit for reference.   FINAL MEDICATION LIST END OF ENCOUNTER: Meds ordered this encounter  Medications   valsartan (DIOVAN) 320 MG tablet    Sig: Take 1 tablet (320 mg total) by mouth every evening.    Dispense:  90 tablet    Refill:  0   hydrochlorothiazide (HYDRODIURIL) 25 MG tablet    Sig: Take 1 tablet (25 mg total) by mouth every morning.    Dispense:  90 tablet    Refill:  0     Medications Discontinued During  This Encounter  Medication Reason   B Complex-C (B-COMPLEX WITH VITAMIN C) tablet Error   mupirocin ointment (BACTROBAN) 2 % Error   oxyCODONE (OXY IR/ROXICODONE) 5 MG immediate release tablet Error   valsartan-hydrochlorothiazide (DIOVAN-HCT) 160-25 MG per tablet Dose change     Current Outpatient Medications:    albuterol (VENTOLIN HFA) 108 (90 Base) MCG/ACT inhaler, Inhale 1-2 puffs into the lungs every 4 (four) hours as needed.,  Disp: , Rfl:    fexofenadine (ALLEGRA) 180 MG tablet, Take 180 mg by mouth daily., Disp: , Rfl:    fluticasone (FLONASE) 50 MCG/ACT nasal spray, Place into both nostrils daily., Disp: , Rfl:    fluticasone (FLOVENT HFA) 110 MCG/ACT inhaler, Inhale into the lungs 2 (two) times daily., Disp: , Rfl:    hydrochlorothiazide (HYDRODIURIL) 25 MG tablet, Take 1 tablet (25 mg total) by mouth every morning., Disp: 90 tablet, Rfl: 0   latanoprost (XALATAN) 0.005 % ophthalmic solution, Place 1 drop into both eyes daily at 12 noon., Disp: , Rfl:    metoprolol succinate (TOPROL-XL) 50 MG 24 hr tablet, Take 50 mg by mouth daily., Disp: , Rfl:    valsartan (DIOVAN) 320 MG tablet, Take 1 tablet (320 mg total) by mouth every evening., Disp: 90 tablet, Rfl: 0  Orders Placed This Encounter  Procedures   Pro b natriuretic peptide (BNP)   Basic metabolic panel   Magnesium   PCV MYOCARDIAL PERFUSION WO LEXISCAN   EKG 12-Lead   PCV ECHOCARDIOGRAM COMPLETE    There are no Patient Instructions on file for this visit.   --Continue cardiac medications as reconciled in final medication list. --Return in about 4 weeks (around 04/29/2021) for Follow up, Review test results, BP. Or sooner if needed. --Continue follow-up with your primary care physician regarding the management of your other chronic comorbid conditions.  Patient's questions and concerns were addressed to her satisfaction. She voices understanding of the instructions provided during this encounter.   This note was  created using a voice recognition software as a result there may be grammatical errors inadvertently enclosed that do not reflect the nature of this encounter. Every attempt is made to correct such errors.  Rex Kras, [place], [facility]  Pager: [phone] Office: [phone]

## 2021-04-01 ENCOUNTER — Other Ambulatory Visit: Payer: Self-pay

## 2021-04-01 ENCOUNTER — Encounter: Payer: Self-pay | Admitting: Cardiology

## 2021-04-01 ENCOUNTER — Ambulatory Visit: Payer: BC Managed Care – PPO | Admitting: Cardiology

## 2021-04-01 VITALS — BP 177/94 | HR 87 | Temp 97.6°F | Resp 16 | Ht 64.0 in | Wt 138.0 lb

## 2021-04-01 DIAGNOSIS — R0609 Other forms of dyspnea: Secondary | ICD-10-CM

## 2021-04-01 DIAGNOSIS — R9431 Abnormal electrocardiogram [ECG] [EKG]: Secondary | ICD-10-CM

## 2021-04-01 DIAGNOSIS — I10 Essential (primary) hypertension: Secondary | ICD-10-CM

## 2021-04-01 DIAGNOSIS — R0600 Dyspnea, unspecified: Secondary | ICD-10-CM

## 2021-04-01 MED ORDER — HYDROCHLOROTHIAZIDE 25 MG PO TABS: 25.0000 mg | ORAL_TABLET | Freq: Every morning | ORAL | 0 refills | Status: DC

## 2021-04-01 MED ORDER — VALSARTAN 320 MG PO TABS: 320.0000 mg | ORAL_TABLET | Freq: Every evening | ORAL | 0 refills | Status: DC

## 2021-04-05 ENCOUNTER — Other Ambulatory Visit: Payer: Self-pay

## 2021-04-05 DIAGNOSIS — I10 Essential (primary) hypertension: Secondary | ICD-10-CM

## 2021-04-06 ENCOUNTER — Other Ambulatory Visit: Payer: Self-pay

## 2021-04-06 ENCOUNTER — Ambulatory Visit: Payer: BC Managed Care – PPO

## 2021-04-06 DIAGNOSIS — R0609 Other forms of dyspnea: Secondary | ICD-10-CM

## 2021-04-06 DIAGNOSIS — R0600 Dyspnea, unspecified: Secondary | ICD-10-CM

## 2021-04-09 LAB — PRO B NATRIURETIC PEPTIDE: NT-Pro BNP: 11 pg/mL (ref 0–301)

## 2021-04-12 LAB — BASIC METABOLIC PANEL
BUN/Creatinine Ratio: 17 (ref 12–28)
BUN: 12 mg/dL (ref 8–27)
CO2: 24 mmol/L (ref 20–29)
Calcium: 10.4 mg/dL — ABNORMAL HIGH (ref 8.7–10.3)
Chloride: 97 mmol/L (ref 96–106)
Creatinine, Ser: 0.72 mg/dL (ref 0.57–1.00)
Glucose: 107 mg/dL — ABNORMAL HIGH (ref 65–99)
Potassium: 3.9 mmol/L (ref 3.5–5.2)
Sodium: 141 mmol/L (ref 134–144)
eGFR: 92 mL/min/{1.73_m2} (ref >59)

## 2021-04-12 LAB — MAGNESIUM: Magnesium: 2 mg/dL (ref 1.6–2.3)

## 2021-04-12 NOTE — Progress Notes (Signed)
Patient is aware 

## 2021-04-15 NOTE — Progress Notes (Signed)
Called and spoke to pt, pt voiced understanding.

## 2021-05-11 ENCOUNTER — Other Ambulatory Visit: Payer: BC Managed Care – PPO

## 2021-05-19 ENCOUNTER — Ambulatory Visit: Payer: BC Managed Care – PPO | Admitting: Cardiology

## 2021-05-20 ENCOUNTER — Ambulatory Visit: Payer: BC Managed Care – PPO | Admitting: Cardiology

## 2021-06-06 ENCOUNTER — Ambulatory Visit: Payer: BC Managed Care – PPO

## 2021-06-06 ENCOUNTER — Other Ambulatory Visit: Payer: Self-pay

## 2021-06-06 DIAGNOSIS — R0609 Other forms of dyspnea: Secondary | ICD-10-CM

## 2021-06-06 DIAGNOSIS — R0600 Dyspnea, unspecified: Secondary | ICD-10-CM

## 2021-06-16 ENCOUNTER — Other Ambulatory Visit: Payer: Self-pay

## 2021-06-16 ENCOUNTER — Encounter: Payer: Self-pay | Admitting: Cardiology

## 2021-06-16 ENCOUNTER — Ambulatory Visit: Payer: BC Managed Care – PPO | Admitting: Cardiology

## 2021-06-16 VITALS — BP 155/78 | HR 88 | Temp 97.3°F | Resp 16 | Ht 64.0 in | Wt 139.2 lb

## 2021-06-16 DIAGNOSIS — R0600 Dyspnea, unspecified: Secondary | ICD-10-CM

## 2021-06-16 DIAGNOSIS — R0609 Other forms of dyspnea: Secondary | ICD-10-CM

## 2021-06-16 DIAGNOSIS — I517 Cardiomegaly: Secondary | ICD-10-CM

## 2021-06-16 DIAGNOSIS — I10 Essential (primary) hypertension: Secondary | ICD-10-CM

## 2021-06-16 MED ORDER — METOPROLOL SUCCINATE ER 100 MG PO TB24: 100.0000 mg | ORAL_TABLET | Freq: Every morning | ORAL | 0 refills | Status: DC

## 2021-06-16 NOTE — Progress Notes (Signed)
Date:  06/16/2021   ID:  Monique Kirby, DOB 1955-11-27, MRN [id number]  PCP:  Pcp, No  Cardiologist:  Rex Kras, DO, [place] (established care 04/01/2021)  Date: 06/16/21 Last Office Visit: 04/01/2021  Chief Complaint  Patient presents with   Hypertension   Results   Follow-up    HPI  Monique Kirby is a 65 y.o. female who presents to the office with a chief complaint of "reevaluation of shortness of breath and blood pressure." Patient's past medical history and cardiovascular risk factors include: HTN, asthma.  She is referred to the office at the request of No ref. provider found for evaluation of dyspnea on exertion.  Patient recently has been experiencing asthma flareups and due to excessive coughing at times what has been experiencing chest pain.  She went to urgent care had an EKG done which was interpreted as abnormal and referred to cardiology for further evaluation and management.  During initial consultation patient is dyspnea on exertion was most likely attributed to her uncontrolled hypertension.  However, given her risk factors and age that shared decision was to proceed with an ischemic evaluation as well.  Patient's valsartan dose was increased to 320 mg p.o. q. afternoon and follow-up blood work notes preserved kidney function and electrolytes.  Patient's blood pressure at home has improved but currently not at goal.  She requested blood pressure management assistance as her primary care provider Dr. Nyoka Cowden had retired.  Reviewed the results of the echo and stress test with her in great detail at today's office visit and noted below for further reference.  FUNCTIONAL STATUS: Limited in hot and humid weather due to her asthma.    ALLERGIES: Allergies  Allergen Reactions   Shrimp [Shellfish Allergy] Anaphylaxis   Geralyn Flash [Fish Allergy] Anaphylaxis and Swelling   Amoxicillin-Pot Clavulanate    Aspirin Other (See Comments)    Makes pt sick   Feldene [Piroxicam]  Swelling   Food Other (See Comments)    Zucchini- mouth swells   Levofloxacin Hives   Levofloxacin    Lodine [Etodolac] Hives   Neosporin Plus Max St    Tape    Latex Itching and Rash   Neosporin [Neomycin-Bacitracin Zn-Polymyx] Rash    MEDICATION LIST PRIOR TO VISIT: Current Meds  Medication Sig   albuterol (VENTOLIN HFA) 108 (90 Base) MCG/ACT inhaler Inhale 1-2 puffs into the lungs every 4 (four) hours as needed.   fexofenadine (ALLEGRA) 180 MG tablet Take 180 mg by mouth daily.   fluticasone (FLONASE) 50 MCG/ACT nasal spray Place into both nostrils daily.   fluticasone (FLOVENT HFA) 110 MCG/ACT inhaler Inhale into the lungs 2 (two) times daily.   hydrochlorothiazide (HYDRODIURIL) 25 MG tablet Take 1 tablet (25 mg total) by mouth every morning.   latanoprost (XALATAN) 0.005 % ophthalmic solution Place 1 drop into both eyes daily at 12 noon.   valsartan (DIOVAN) 320 MG tablet Take 1 tablet (320 mg total) by mouth every evening.   [DISCONTINUED] metoprolol succinate (TOPROL-XL) 50 MG 24 hr tablet Take 50 mg by mouth daily.     PAST MEDICAL HISTORY: Past Medical History:  Diagnosis Date   Asthma    Headache(784.0)    migranes   Hypertension    Rash Tuesday pm 09/26/11   left upper leg ...     PAST SURGICAL HISTORY: Past Surgical History:  Procedure Laterality Date   COLONOSCOPY     DILATION AND CURETTAGE OF UTERUS     DISTAL ULNA EXCISION  ENDOMETRIAL ABLATION     HEEL SPUR SURGERY     JOINT REPLACEMENT  02/14/2011   knee rt   KNEE ARTHROSCOPY  2012   LUMBAR LAMINECTOMY/DECOMPRESSION MICRODISCECTOMY  09/28/2011   Procedure: LUMBAR LAMINECTOMY/DECOMPRESSION MICRODISCECTOMY;  Surgeon: Dahlia Bailiff;  Location: [facility];  Service: Orthopedics;  Laterality: Right;  RIGHT L3-4 DISCECTOMY   WRIST SURGERY      FAMILY HISTORY: The patient family history includes Congestive Heart Failure in her brother; Hypertension in her brother.  SOCIAL HISTORY:  The patient  reports  that she has never smoked. She has never used smokeless tobacco. She reports that she does not drink alcohol and does not use drugs.  REVIEW OF SYSTEMS: Review of Systems  Constitutional: Negative for chills and fever.  HENT:  Negative for hoarse voice and nosebleeds.   Eyes:  Negative for discharge, double vision and pain.  Cardiovascular:  Positive for dyspnea on exertion (Improving). Negative for chest pain, claudication, leg swelling, near-syncope, orthopnea, palpitations, paroxysmal nocturnal dyspnea and syncope.  Respiratory:  Negative for hemoptysis and shortness of breath.   Musculoskeletal:  Negative for muscle cramps and myalgias.  Gastrointestinal:  Negative for abdominal pain, constipation, diarrhea, hematemesis, hematochezia, melena, nausea and vomiting.  Neurological:  Negative for dizziness and light-headedness.   PHYSICAL EXAM: Vitals with BMI 06/16/2021 06/16/2021 04/01/2021  Height - _0  -  Weight - 139 lbs 3 oz -  BMI - 16.10 -  Systolic 960 454 098  Diastolic 78 80 94  Pulse 88 100 87    CONSTITUTIONAL: Well-developed and well-nourished. No acute distress.  SKIN: Skin is warm and dry. No rash noted. No cyanosis. No pallor. No jaundice HEAD: Normocephalic and atraumatic.  EYES: No scleral icterus MOUTH/THROAT: Moist oral membranes.  NECK: No JVD present. No thyromegaly noted. No carotid bruits  LYMPHATIC: No visible cervical adenopathy.  CHEST Normal respiratory effort. No intercostal retractions  LUNGS: Clear to auscultation bilaterally.  No stridor. No wheezes. No rales.  CARDIOVASCULAR: Regular, positive S1-S2, no murmurs rubs or gallops appreciated ABDOMINAL: Soft, nontender, nondistended, positive bowel sounds in all 4 quadrants, no apparent ascites.  EXTREMITIES: No peripheral edema  HEMATOLOGIC: No significant bruising NEUROLOGIC: Oriented to person, place, and time. Nonfocal. Normal muscle tone.  PSYCHIATRIC: Normal mood and affect. Normal behavior.  Cooperative  CARDIAC DATABASE: EKG: 04/01/2021: Normal sinus rhythm, 83 bpm, ST-T changes in the high lateral and lateral leads, without underlying injury pattern.   Echocardiogram: 04/06/2021: Hyperdynamic LV systolic function with visual EF >70%. Left ventricle size is decreased. Moderate to severe left ventricular hypertrophy. Normal global wall motion. Normal diastolic filling pattern, normal LAP. Mild tricuspid regurgitation. No evidence of pulmonary hypertension. No prior study for comparison.   Stress Testing: No results found for this or any previous visit from the past 1095 days.  Heart Catheterization: Exercise Sestamibi stress test 06/06/2021: 1 Day Rest/Stress Protocol. Exercise time 2 minutes 9 seconds on Bruce protocol, achieved 4.6 METS, 106% of APMHR. Stress ECG nondiagnostic for ischemia due to baseline ST-T segments.  Normal myocardial perfusion without convincing evidence of reversible myocardial ischemia or prior infarct. LVEF gated SPECT 67% without regional wall motion abnormalities. Low risk study.  LABORATORY DATA: CBC Latest Ref Rng & Units 09/25/2011 02/17/2011 02/16/2011  WBC 4.0 - 10.5 K/uL 7.6 8.3 9.8  Hemoglobin 12.0 - 15.0 g/dL 13.7 11.3(L) 12.3  Hematocrit 36.0 - 46.0 % 40.2 34.1(L) 36.7  Platelets 150 - 400 K/uL 342 301 278    CMP Latest Ref  Rng & Units 04/08/2021 09/25/2011 02/17/2011  Glucose 65 - 99 mg/dL 107(H) 115(H) 126(H)  BUN 8 - 27 mg/dL _0 Creatinine 0.57 - 1.00 mg/dL 0.72 0.54 0.86  Sodium 134 - 144 mmol/L 141 140 126(L)  Potassium 3.5 - 5.2 mmol/L 3.9 3.7 3.9  Chloride 96 - 106 mmol/L 97 97 86(L)  CO2 20 - 29 mmol/L 24 30 33(H)  Calcium 8.7 - 10.3 mg/dL 10.4(H) 10.7(H) 9.3  Total Protein 6.0 - 8.3 g/dL - - -  Total Bilirubin 0.3 - 1.2 mg/dL - - -  Alkaline Phos 39 - 117 U/L - - -  AST 0 - 37 U/L - - -  ALT 0 - 35 U/L - - -    Lipid Panel  No results found for: CHOL, TRIG, HDL, CHOLHDL, VLDL, LDLCALC, LDLDIRECT, LABVLDL  No  components found for: NTPROBNP Recent Labs    04/08/21 1004  PROBNP 11   No results for input(s): TSH in the last 8760 hours.  BMP Recent Labs    04/08/21 0959  NA 141  K 3.9  CL 97  CO2 24  GLUCOSE 107*  BUN 12  CREATININE 0.72  CALCIUM 10.4*    HEMOGLOBIN A1C No results found for: HGBA1C, MPG  External Labs:  Date Collected: 01/11/2021 , information obtained by referring physician Potassium: 3.6 Creatinine 0.59 mg/dL. eGFR: 111 mL/min per 1.73 m Hemoglobin: 13.0 g/dL and hematocrit: 40.2 % AST: 16 , ALT: 11 , alkaline phosphatase: 49  D-dimers 0.36 (within normal limits)  IMPRESSION:    ICD-10-CM   1. Dyspnea on exertion  R06.00     2. Benign hypertension  I10 metoprolol succinate (TOPROL-XL) 100 MG 24 hr tablet    3. Left ventricular hypertrophy  I51.7 MR CARDIAC MORPHOLOGY W WO CONTRAST    Basic metabolic panel    CBC       RECOMMENDATIONS: Monique Kirby is a 65 y.o. female whose past medical history and cardiac risk factors include: HTN, asthma.   Dyspnea on exertion Improving. Suspect secondary to better blood pressure management. Overall euvolemic and not in congestive heart failure. Monitor for now  Benign hypertension Office blood pressures are improving but currently not at goal Home blood pressures are also improving but not at goal  Medications reconciled. Low-salt diet recommended. Will increase Toprol-XL to 100 mg p.o. daily  Left ventricular hypertrophy Echocardiogram notes hyperdynamic LV with moderate to severe left ventricular hypertrophy without any significant valvular heart disease.  I suspect that the LVH is most likely secondary to uncontrolled hypertension for a prolonged period of time; however, cannot rule out infiltrative disease.  Shared decision was to proceed with cardiac MRI for further evaluation and management.  Further recommendations to follow.   FINAL MEDICATION LIST END OF ENCOUNTER: Meds ordered this  encounter  Medications   metoprolol succinate (TOPROL-XL) 100 MG 24 hr tablet    Sig: Take 1 tablet (100 mg total) by mouth every morning.    Dispense:  90 tablet    Refill:  0     Medications Discontinued During This Encounter  Medication Reason   metoprolol succinate (TOPROL-XL) 50 MG 24 hr tablet Reorder     Current Outpatient Medications:    albuterol (VENTOLIN HFA) 108 (90 Base) MCG/ACT inhaler, Inhale 1-2 puffs into the lungs every 4 (four) hours as needed., Disp: , Rfl:    fexofenadine (ALLEGRA) 180 MG tablet, Take 180 mg by mouth daily., Disp: , Rfl:    fluticasone (  FLONASE) 50 MCG/ACT nasal spray, Place into both nostrils daily., Disp: , Rfl:    fluticasone (FLOVENT HFA) 110 MCG/ACT inhaler, Inhale into the lungs 2 (two) times daily., Disp: , Rfl:    hydrochlorothiazide (HYDRODIURIL) 25 MG tablet, Take 1 tablet (25 mg total) by mouth every morning., Disp: 90 tablet, Rfl: 0   latanoprost (XALATAN) 0.005 % ophthalmic solution, Place 1 drop into both eyes daily at 12 noon., Disp: , Rfl:    valsartan (DIOVAN) 320 MG tablet, Take 1 tablet (320 mg total) by mouth every evening., Disp: 90 tablet, Rfl: 0   metoprolol succinate (TOPROL-XL) 100 MG 24 hr tablet, Take 1 tablet (100 mg total) by mouth every morning., Disp: 90 tablet, Rfl: 0  Orders Placed This Encounter  Procedures   MR CARDIAC MORPHOLOGY W WO CONTRAST   Basic metabolic panel   CBC    There are no Patient Instructions on file for this visit.   --Continue cardiac medications as reconciled in final medication list. --Return in about 6 weeks (around 07/28/2021) for Follow up, cmri. Or sooner if needed. --Continue follow-up with your primary care physician regarding the management of your other chronic comorbid conditions.  Patient's questions and concerns were addressed to her satisfaction. She voices understanding of the instructions provided during this encounter.   This note was created using a voice recognition  software as a result there may be grammatical errors inadvertently enclosed that do not reflect the nature of this encounter. Every attempt is made to correct such errors.  Rex Kras, [place], [facility]  Pager: [phone] Office: [phone]

## 2021-06-27 ENCOUNTER — Other Ambulatory Visit: Payer: Self-pay | Admitting: Cardiology

## 2021-06-27 DIAGNOSIS — I10 Essential (primary) hypertension: Secondary | ICD-10-CM

## 2021-07-18 ENCOUNTER — Telehealth: Payer: Self-pay | Admitting: Cardiology

## 2021-07-18 NOTE — Telephone Encounter (Signed)
Patient says she started breaking out, went to urgent care where she was prescribed a different medication and told to stop taking metoprolol. Patient wants to know if metoprolol may be causing break outs, and if she should try a different medication.

## 2021-07-18 NOTE — Telephone Encounter (Signed)
What does she mean with "breaking out?"

## 2021-07-19 NOTE — Telephone Encounter (Signed)
Pts chest and neck is breaking out in hives. She took benadryl and her back started to break out. By the time she went to urgent care, her neck broke out again. When she left the [facility] room she had broke out again while on her way to her mothers house. She stated that she noticed the breakout would get worse when she took the metoprolol. Pt stated that she did not take any metoprolol this weekend or yesterday. She said she has been washing her neck with dial soap, putting lotion on it, and taking benadryl and it is slowly getting better. Please advise.

## 2021-07-22 NOTE — Telephone Encounter (Signed)
Could you please follow up on this and see how she is doing?

## 2021-07-22 NOTE — Telephone Encounter (Signed)
Tried calling patient --no answer

## 2021-07-25 NOTE — Telephone Encounter (Signed)
Okay 

## 2021-07-25 NOTE — Telephone Encounter (Signed)
Patient said she went to her allergist they saw her Thursday because she had a really bad break out they gave her an injection and that helped she is back at work today she still has some hives but it is not bad as it was and she was also given a tropical cream. She said she will get her labs done next week than f/u

## 2021-07-25 NOTE — Telephone Encounter (Signed)
Please check up on her.

## 2021-07-28 ENCOUNTER — Ambulatory Visit: Payer: BC Managed Care – PPO | Admitting: Cardiology

## 2021-08-08 ENCOUNTER — Ambulatory Visit: Payer: BC Managed Care – PPO | Admitting: Cardiology

## 2021-08-08 ENCOUNTER — Encounter: Payer: Self-pay | Admitting: Cardiology

## 2021-08-08 ENCOUNTER — Other Ambulatory Visit: Payer: Self-pay

## 2021-08-08 VITALS — BP 160/87 | HR 116 | Temp 98.3°F | Resp 17 | Ht 64.0 in | Wt 139.8 lb

## 2021-08-08 DIAGNOSIS — I10 Essential (primary) hypertension: Secondary | ICD-10-CM

## 2021-08-08 DIAGNOSIS — I517 Cardiomegaly: Secondary | ICD-10-CM

## 2021-08-08 DIAGNOSIS — R0609 Other forms of dyspnea: Secondary | ICD-10-CM

## 2021-08-08 MED ORDER — DILTIAZEM HCL ER COATED BEADS 120 MG PO CP24: 120.0000 mg | ORAL_CAPSULE | Freq: Every morning | ORAL | 0 refills | Status: DC

## 2021-08-08 NOTE — Progress Notes (Signed)
Date:  08/08/2021   ID:  Monique Kirby, DOB 17-Oct-1955, MRN [id number]  PCP:  Pcp, No  Cardiologist:  Rex Kras, DO, [place] (established care 04/01/2021)  Date: 08/08/21 Last Office Visit: 06/16/2021  Chief Complaint  Patient presents with   Follow-up    6 WEEKS   Dyspnea on exertion    HPI  Monique Kirby is a 65 y.o. female who presents to the office with a chief complaint of " shortness of breath and blood pressure management." Patient's past medical history and cardiovascular risk factors include: HTN, asthma.  She is referred to the office for evaluation of dyspnea on exertion.  After having flareups of asthma and intermittently experiencing chest pain she had gone to urgent care for evaluation.  EKG at the urgent care was interpreted as abnormal and referred to cardiology for further evaluation and management.  During initial consultation based on the history of present illness it appeared that the dyspnea on exertion was due to uncontrolled hypertension.  However given her risk factors she did undergo ischemic work-up as outlined below.  Echo noted hyperdynamic LVEF with moderate to severe LVH without any significant valvular heart disease.  At the last office visit patient requested my assistance with regards to blood pressure management as Dr. Nyoka Cowden her original PCP had retired.  Given her LVH and findings concerning for HCM recommended metoprolol.  However patient states that metoprolol caused her to have hives needing ED evaluation and therefore the medication was discontinued.  She was scheduled for an MRI to evaluate for infiltrative processes but this is postponed until the first of the year due to financial reasons.  Patient did not bring in a blood pressure log for me to review.  However, states that her home blood pressures range between 440-347 mmHg and diastolic blood pressures range between 78-88 mmHg.  Her shortness of breath with effort related activities  have improved significantly and now most likely associated with asthma flareups.  She denies any active chest pain or recent hospitalizations or urgent care visits for cardiovascular symptoms.  FUNCTIONAL STATUS: Limited in hot and humid weather due to her asthma.    ALLERGIES: Allergies  Allergen Reactions   Shrimp [Shellfish Allergy] Anaphylaxis   Geralyn Flash [Fish Allergy] Anaphylaxis and Swelling   Metoprolol Hives   Amoxicillin-Pot Clavulanate    Aspirin Other (See Comments)    Makes pt sick   Feldene [Piroxicam] Swelling   Food Other (See Comments)    Zucchini- mouth swells   Levofloxacin Hives   Levofloxacin    Lodine [Etodolac] Hives   Neosporin Plus Max St    Sulfur     Other reaction(s): Unknown   Tape    Latex Itching and Rash   Neosporin [Neomycin-Bacitracin Zn-Polymyx] Rash    MEDICATION LIST PRIOR TO VISIT: Current Meds  Medication Sig   albuterol (VENTOLIN HFA) 108 (90 Base) MCG/ACT inhaler Inhale 1-2 puffs into the lungs every 4 (four) hours as needed.   diltiazem (CARDIZEM CD) 120 MG 24 hr capsule Take 1 capsule (120 mg total) by mouth every morning.   fexofenadine (ALLEGRA) 180 MG tablet Take 180 mg by mouth daily.   fluticasone (FLONASE) 50 MCG/ACT nasal spray Place into both nostrils daily.   fluticasone (FLOVENT HFA) 110 MCG/ACT inhaler Inhale into the lungs 2 (two) times daily.   hydrochlorothiazide (HYDRODIURIL) 25 MG tablet TAKE 1 TABLET BY MOUTH EVERY DAY IN THE MORNING   hydrOXYzine (ATARAX/VISTARIL) 25 MG tablet Take 25 mg by  mouth every 8 (eight) hours.   latanoprost (XALATAN) 0.005 % ophthalmic solution Place 1 drop into both eyes daily at 12 noon.   valsartan (DIOVAN) 320 MG tablet TAKE 1 TABLET BY MOUTH EVERY EVENING.     PAST MEDICAL HISTORY: Past Medical History:  Diagnosis Date   Asthma    Headache(784.0)    migranes   Hypertension    Rash Tuesday pm 09/26/11   left upper leg ...     PAST SURGICAL HISTORY: Past Surgical History:   Procedure Laterality Date   COLONOSCOPY     DILATION AND CURETTAGE OF UTERUS     DISTAL ULNA EXCISION     ENDOMETRIAL ABLATION     HEEL SPUR SURGERY     JOINT REPLACEMENT  02/14/2011   knee rt   KNEE ARTHROSCOPY  2012   LUMBAR LAMINECTOMY/DECOMPRESSION MICRODISCECTOMY  09/28/2011   Procedure: LUMBAR LAMINECTOMY/DECOMPRESSION MICRODISCECTOMY;  Surgeon: Dahlia Bailiff;  Location: [facility];  Service: Orthopedics;  Laterality: Right;  RIGHT L3-4 DISCECTOMY   WRIST SURGERY      FAMILY HISTORY: The patient family history includes Congestive Heart Failure in her brother; Hypertension in her brother and mother; Scleroderma in her father.  SOCIAL HISTORY:  The patient  reports that she has never smoked. She has never used smokeless tobacco. She reports that she does not drink alcohol and does not use drugs.  REVIEW OF SYSTEMS: Review of Systems  Constitutional: Negative for chills and fever.  HENT:  Negative for hoarse voice and nosebleeds.   Eyes:  Negative for discharge, double vision and pain.  Cardiovascular:  Positive for dyspnea on exertion (Improving). Negative for chest pain, claudication, leg swelling, near-syncope, orthopnea, palpitations, paroxysmal nocturnal dyspnea and syncope.  Respiratory:  Negative for hemoptysis and shortness of breath.   Musculoskeletal:  Negative for muscle cramps and myalgias.  Gastrointestinal:  Negative for abdominal pain, constipation, diarrhea, hematemesis, hematochezia, melena, nausea and vomiting.  Neurological:  Negative for dizziness and light-headedness.   PHYSICAL EXAM: Vitals with BMI 08/08/2021 08/08/2021 06/16/2021  Height - 5' 4"  -  Weight - 139 lbs 13 oz -  BMI - 30.16 -  Systolic 010 932 355  Diastolic 87 96 78  Pulse 732 139 88    CONSTITUTIONAL: Well-developed and well-nourished. No acute distress.  SKIN: Skin is warm and dry. No rash noted. No cyanosis. No pallor. No jaundice HEAD: Normocephalic and atraumatic.  EYES: No scleral  icterus MOUTH/THROAT: Moist oral membranes.  NECK: No JVD present. No thyromegaly noted. No carotid bruits  LYMPHATIC: No visible cervical adenopathy.  CHEST Normal respiratory effort. No intercostal retractions  LUNGS: Clear to auscultation bilaterally.  No stridor. No wheezes. No rales.  CARDIOVASCULAR: Regular, positive S1-S2, no murmurs rubs or gallops appreciated ABDOMINAL: Soft, nontender, nondistended, positive bowel sounds in all 4 quadrants, no apparent ascites.  EXTREMITIES: No peripheral edema  HEMATOLOGIC: No significant bruising NEUROLOGIC: Oriented to person, place, and time. Nonfocal. Normal muscle tone.  PSYCHIATRIC: Normal mood and affect. Normal behavior. Cooperative  CARDIAC DATABASE: EKG: 04/01/2021: Normal sinus rhythm, 83 bpm, ST-T changes in the high lateral and lateral leads, without underlying injury pattern.   Echocardiogram: 04/06/2021: Hyperdynamic LV systolic function with visual EF >70%. Left ventricle size is decreased. Moderate to severe left ventricular hypertrophy. Normal global wall motion. Normal diastolic filling pattern, normal LAP. Mild tricuspid regurgitation. No evidence of pulmonary hypertension. No prior study for comparison.   Stress Testing: No results found for this or any previous visit from the  past 1095 days.  Heart Catheterization: Exercise Sestamibi stress test 06/06/2021: 1 Day Rest/Stress Protocol. Exercise time 2 minutes 9 seconds on Bruce protocol, achieved 4.6 METS, 106% of APMHR. Stress ECG nondiagnostic for ischemia due to baseline ST-T segments.  Normal myocardial perfusion without convincing evidence of reversible myocardial ischemia or prior infarct. LVEF gated SPECT 67% without regional wall motion abnormalities. Low risk study.  LABORATORY DATA: CBC Latest Ref Rng & Units 09/25/2011 02/17/2011 02/16/2011  WBC 4.0 - 10.5 K/uL 7.6 8.3 9.8  Hemoglobin 12.0 - 15.0 g/dL 13.7 11.3(L) 12.3  Hematocrit 36.0 - 46.0 % 40.2 34.1(L)  36.7  Platelets 150 - 400 K/uL 342 301 278    CMP Latest Ref Rng & Units 04/08/2021 09/25/2011 02/17/2011  Glucose 65 - 99 mg/dL 107(H) 115(H) 126(H)  BUN 8 - 27 mg/dL 12 13 14   Creatinine 0.57 - 1.00 mg/dL 0.72 0.54 0.86  Sodium 134 - 144 mmol/L 141 140 126(L)  Potassium 3.5 - 5.2 mmol/L 3.9 3.7 3.9  Chloride 96 - 106 mmol/L 97 97 86(L)  CO2 20 - 29 mmol/L 24 30 33(H)  Calcium 8.7 - 10.3 mg/dL 10.4(H) 10.7(H) 9.3  Total Protein 6.0 - 8.3 g/dL - - -  Total Bilirubin 0.3 - 1.2 mg/dL - - -  Alkaline Phos 39 - 117 U/L - - -  AST 0 - 37 U/L - - -  ALT 0 - 35 U/L - - -    Lipid Panel  No results found for: CHOL, TRIG, HDL, CHOLHDL, VLDL, LDLCALC, LDLDIRECT, LABVLDL  No components found for: NTPROBNP Recent Labs    04/08/21 1004  PROBNP 11   No results for input(s): TSH in the last 8760 hours.  BMP Recent Labs    04/08/21 0959  NA 141  K 3.9  CL 97  CO2 24  GLUCOSE 107*  BUN 12  CREATININE 0.72  CALCIUM 10.4*    HEMOGLOBIN A1C No results found for: HGBA1C, MPG  External Labs:  Date Collected: 01/11/2021 , information obtained by referring physician Potassium: 3.6 Creatinine 0.59 mg/dL. eGFR: 111 mL/min per 1.73 m Hemoglobin: 13.0 g/dL and hematocrit: 40.2 % AST: 16 , ALT: 11 , alkaline phosphatase: 49  D-dimers 0.36 (within normal limits)  IMPRESSION:    ICD-10-CM   1. Benign hypertension  I10 diltiazem (CARDIZEM CD) 120 MG 24 hr capsule    2. Dyspnea on exertion  R06.09     3. Left ventricular hypertrophy  I51.7        RECOMMENDATIONS: Monique Kirby is a 65 y.o. female whose past medical history and cardiac risk factors include: HTN, asthma.   Benign hypertension Office blood pressures are improving but currently not at goal Home blood pressures are also improving but not at goal  Did not tolerate Toprol-XL and secondary to hives. Will add diltiazem 120 mg p.o. daily Medications reconciled. Low-salt diet recommended.  Dyspnea on  exertion Improving. Suspect secondary to uncontrolled hypertension. Overall euvolemic and not in congestive heart failure. Echo: LVEF greater than 70%, moderate to severe LVH, mild TR, no pulm hypertension RVSP Recommend ischemic evaluation after blood pressure management Monitor for now  Left ventricular hypertrophy Echocardiogram notes hyperdynamic LV with moderate to severe left ventricular hypertrophy without any significant valvular heart disease.  I suspect that the LVH is most likely secondary to uncontrolled hypertension for a prolonged period of time; however, cannot rule out infiltrative disease or HCM.  Shared decision was to proceed with cardiac MRI for further evaluation and  management.  Further recommendations to follow.  Patient plans to have it completed in January 2023.  FINAL MEDICATION LIST END OF ENCOUNTER: Meds ordered this encounter  Medications   diltiazem (CARDIZEM CD) 120 MG 24 hr capsule    Sig: Take 1 capsule (120 mg total) by mouth every morning.    Dispense:  90 capsule    Refill:  0      Medications Discontinued During This Encounter  Medication Reason   metoprolol succinate (TOPROL-XL) 100 MG 24 hr tablet      Current Outpatient Medications:    albuterol (VENTOLIN HFA) 108 (90 Base) MCG/ACT inhaler, Inhale 1-2 puffs into the lungs every 4 (four) hours as needed., Disp: , Rfl:    diltiazem (CARDIZEM CD) 120 MG 24 hr capsule, Take 1 capsule (120 mg total) by mouth every morning., Disp: 90 capsule, Rfl: 0   fexofenadine (ALLEGRA) 180 MG tablet, Take 180 mg by mouth daily., Disp: , Rfl:    fluticasone (FLONASE) 50 MCG/ACT nasal spray, Place into both nostrils daily., Disp: , Rfl:    fluticasone (FLOVENT HFA) 110 MCG/ACT inhaler, Inhale into the lungs 2 (two) times daily., Disp: , Rfl:    hydrochlorothiazide (HYDRODIURIL) 25 MG tablet, TAKE 1 TABLET BY MOUTH EVERY DAY IN THE MORNING, Disp: 90 tablet, Rfl: 0   hydrOXYzine (ATARAX/VISTARIL) 25 MG tablet, Take  25 mg by mouth every 8 (eight) hours., Disp: , Rfl:    latanoprost (XALATAN) 0.005 % ophthalmic solution, Place 1 drop into both eyes daily at 12 noon., Disp: , Rfl:    valsartan (DIOVAN) 320 MG tablet, TAKE 1 TABLET BY MOUTH EVERY EVENING., Disp: 90 tablet, Rfl: 0  No orders of the defined types were placed in this encounter.   There are no Patient Instructions on file for this visit.   --Continue cardiac medications as reconciled in final medication list. --Return in about 3 months (around 11/08/2021) for Follow up, Dyspnea, BP, CMRI. Or sooner if needed. --Continue follow-up with your primary care physician regarding the management of your other chronic comorbid conditions.  Patient's questions and concerns were addressed to her satisfaction. She voices understanding of the instructions provided during this encounter.   This note was created using a voice recognition software as a result there may be grammatical errors inadvertently enclosed that do not reflect the nature of this encounter. Every attempt is made to correct such errors.  Rex Kras, [place], [facility]  Pager: [phone] Office: [phone]

## 2021-08-10 ENCOUNTER — Other Ambulatory Visit: Payer: Self-pay

## 2021-08-10 DIAGNOSIS — I517 Cardiomegaly: Secondary | ICD-10-CM

## 2021-09-01 ENCOUNTER — Other Ambulatory Visit: Payer: Self-pay | Admitting: Cardiology

## 2021-09-01 DIAGNOSIS — I10 Essential (primary) hypertension: Secondary | ICD-10-CM

## 2021-09-16 LAB — BASIC METABOLIC PANEL
BUN/Creatinine Ratio: 19 (ref 12–28)
BUN: 13 mg/dL (ref 8–27)
CO2: 27 mmol/L (ref 20–29)
Calcium: 10.1 mg/dL (ref 8.7–10.3)
Chloride: 97 mmol/L (ref 96–106)
Creatinine, Ser: 0.69 mg/dL (ref 0.57–1.00)
Glucose: 125 mg/dL — ABNORMAL HIGH (ref 70–99)
Potassium: 3.5 mmol/L (ref 3.5–5.2)
Sodium: 140 mmol/L (ref 134–144)
eGFR: 96 mL/min/{1.73_m2} (ref >59)

## 2021-09-16 LAB — CBC
Hematocrit: 37.4 % (ref 34.0–46.6)
Hemoglobin: 12.5 g/dL (ref 11.1–15.9)
MCH: 28.8 pg (ref 26.6–33.0)
MCHC: 33.4 g/dL (ref 31.5–35.7)
MCV: 86 fL (ref 79–97)
Platelets: 364 10*3/uL (ref 150–450)
RBC: 4.34 x10E6/uL (ref 3.77–5.28)
RDW: 12.2 % (ref 11.7–15.4)
WBC: 8.2 10*3/uL (ref 3.4–10.8)

## 2021-09-21 NOTE — Progress Notes (Signed)
Called and spoke to pt regarding lab results, pt voiced understanding.

## 2021-09-22 ENCOUNTER — Other Ambulatory Visit: Payer: Self-pay | Admitting: Cardiology

## 2021-09-22 DIAGNOSIS — I10 Essential (primary) hypertension: Secondary | ICD-10-CM

## 2021-11-03 ENCOUNTER — Other Ambulatory Visit: Payer: Self-pay | Admitting: Cardiology

## 2021-11-03 ENCOUNTER — Telehealth: Payer: Self-pay | Admitting: Cardiology

## 2021-11-03 DIAGNOSIS — I10 Essential (primary) hypertension: Secondary | ICD-10-CM

## 2021-11-03 NOTE — Telephone Encounter (Signed)
Patient says she needs to have her blood work  done 30 days before her next appointment. She was sick and could not complete her MRI in time for the appointment, so she knows she will have to reschedule the follow up. She wants to know if this means she will need to redo her blood work and also if we can fax over the MRI form she needs for [facility].

## 2021-11-08 ENCOUNTER — Ambulatory Visit: Payer: BC Managed Care – PPO | Admitting: Cardiology

## 2021-11-08 NOTE — Telephone Encounter (Signed)
She probably will need repeat labs within 30 days.  Please order a CBC once she knows when the MRI will be completed.  If additional assistance is needed please call the MRI coordinator at [facility] or talk to [facility].

## 2021-11-10 ENCOUNTER — Other Ambulatory Visit: Payer: Self-pay

## 2021-11-10 DIAGNOSIS — I10 Essential (primary) hypertension: Secondary | ICD-10-CM

## 2021-12-22 ENCOUNTER — Other Ambulatory Visit: Payer: Self-pay | Admitting: Cardiology

## 2021-12-22 DIAGNOSIS — I10 Essential (primary) hypertension: Secondary | ICD-10-CM

## 2022-01-30 ENCOUNTER — Other Ambulatory Visit: Payer: Self-pay | Admitting: Cardiology

## 2022-01-30 DIAGNOSIS — I10 Essential (primary) hypertension: Secondary | ICD-10-CM

## 2022-03-23 ENCOUNTER — Other Ambulatory Visit: Payer: Self-pay | Admitting: Cardiology

## 2022-03-23 DIAGNOSIS — I10 Essential (primary) hypertension: Secondary | ICD-10-CM

## 2022-04-29 ENCOUNTER — Other Ambulatory Visit: Payer: Self-pay | Admitting: Cardiology

## 2022-04-29 DIAGNOSIS — I10 Essential (primary) hypertension: Secondary | ICD-10-CM

## 2022-05-15 ENCOUNTER — Other Ambulatory Visit: Payer: Self-pay

## 2022-05-15 DIAGNOSIS — I10 Essential (primary) hypertension: Secondary | ICD-10-CM

## 2022-05-15 MED ORDER — DILTIAZEM HCL ER COATED BEADS 120 MG PO CP24: 120.0000 mg | ORAL_CAPSULE | Freq: Every morning | ORAL | 0 refills | Status: DC

## 2022-05-25 ENCOUNTER — Ambulatory Visit: Payer: BC Managed Care – PPO | Admitting: Cardiology

## 2022-06-08 ENCOUNTER — Ambulatory Visit: Payer: BC Managed Care – PPO | Admitting: Cardiology

## 2022-06-18 ENCOUNTER — Other Ambulatory Visit: Payer: Self-pay | Admitting: Cardiology

## 2022-06-18 DIAGNOSIS — I10 Essential (primary) hypertension: Secondary | ICD-10-CM

## 2022-07-13 ENCOUNTER — Other Ambulatory Visit: Payer: Self-pay | Admitting: Cardiology

## 2022-07-13 DIAGNOSIS — I10 Essential (primary) hypertension: Secondary | ICD-10-CM

## 2022-08-29 ENCOUNTER — Other Ambulatory Visit: Payer: Self-pay

## 2022-08-29 DIAGNOSIS — I10 Essential (primary) hypertension: Secondary | ICD-10-CM

## 2022-08-29 MED ORDER — DILTIAZEM HCL ER COATED BEADS 120 MG PO CP24: 120.0000 mg | ORAL_CAPSULE | Freq: Every morning | ORAL | 0 refills | Status: DC

## 2022-09-08 ENCOUNTER — Ambulatory Visit: Payer: BC Managed Care – PPO | Admitting: Cardiology

## 2022-09-08 ENCOUNTER — Encounter: Payer: Self-pay | Admitting: Cardiology

## 2022-09-08 VITALS — BP 138/78 | HR 75 | Resp 14 | Ht 64.0 in | Wt 132.8 lb

## 2022-09-08 DIAGNOSIS — R0609 Other forms of dyspnea: Secondary | ICD-10-CM

## 2022-09-08 DIAGNOSIS — I10 Essential (primary) hypertension: Secondary | ICD-10-CM

## 2022-09-08 DIAGNOSIS — I517 Cardiomegaly: Secondary | ICD-10-CM

## 2022-09-08 NOTE — Progress Notes (Signed)
Date:  09/08/2022   ID:  Monique Kirby, DOB [birth year], MRN [id number]  PCP:  Pcp, No  Cardiologist:  Rex Kras, DO, [place] (established care 04/01/2021)  Date: 09/08/22 Last Office Visit: 08/08/2021  Chief Complaint  Patient presents with   Hypertension    HPI  Monique Kirby is a 66 y.o. female whose past medical history and cardiovascular risk factors include: HTN, asthma.  Patient was referred to the practice for evaluation and management of dyspnea on exertion.  Based on the workup it was felt that her shortness of breath with effort related activities was likely due to uncontrolled hypertension.  She requested assistance with blood pressure management and her medications were being titrated.  In addition her echocardiogram noted hyperdynamic LVEF, LVH, and given her symptoms underlying infiltrative cardiomyopathy or HCM cannot be ruled out.  She was advised undergo cardiac MRI which is still pending for reasons unknown.  She now presents for 1 year follow-up visit.  Over the last 1 year she has not had any cardiovascular symptoms or hospitalization.  Her shortness of breath still is present likely due to underlying asthma.  Home blood pressures range between 120-134 mmHg (per patient).  The numbers are elevated at today's office visit due to personal stress playing a role.  Cardiac MRI still pending but she is willing to proceed forward.  She denies anginal discomfort or heart failure symptoms.  Overall functional capacity remains stable.   ALLERGIES: Allergies  Allergen Reactions   Shrimp [Shellfish Allergy] Anaphylaxis   Geralyn Flash [Fish Allergy] Anaphylaxis and Swelling   Metoprolol Hives   Amoxicillin-Pot Clavulanate    Aspirin Other (See Comments)    Makes pt sick   Feldene [Piroxicam] Swelling   Food Other (See Comments)    Zucchini- mouth swells   Levofloxacin Hives   Levofloxacin    Lodine [Etodolac] Hives   Neo-Bacit-Poly-Lidocaine    Sulfur      Other reaction(s): Unknown   Tape    Latex Itching and Rash   Neosporin [Neomycin-Bacitracin Zn-Polymyx] Rash    MEDICATION LIST PRIOR TO VISIT: Current Meds  Medication Sig   albuterol (VENTOLIN HFA) 108 (90 Base) MCG/ACT inhaler Inhale 1-2 puffs into the lungs every 4 (four) hours as needed.   cetirizine (ZYRTEC) 10 MG tablet Take 10 mg by mouth daily as needed for allergies.   diltiazem (CARDIZEM CD) 120 MG 24 hr capsule Take 1 capsule (120 mg total) by mouth every morning.   fexofenadine (ALLEGRA) 180 MG tablet Take 180 mg by mouth daily as needed.   fluticasone (FLONASE) 50 MCG/ACT nasal spray Place into both nostrils daily.   fluticasone (FLOVENT HFA) 110 MCG/ACT inhaler Inhale into the lungs 2 (two) times daily.   hydrochlorothiazide (HYDRODIURIL) 25 MG tablet TAKE 1 TABLET BY MOUTH EVERY DAY IN THE MORNING   hydrOXYzine (ATARAX/VISTARIL) 25 MG tablet Take 25 mg by mouth every 8 (eight) hours.   latanoprost (XALATAN) 0.005 % ophthalmic solution Place 1 drop into both eyes daily at 12 noon.   valsartan (DIOVAN) 320 MG tablet TAKE 1 TABLET BY MOUTH EVERY DAY IN THE EVENING     PAST MEDICAL HISTORY: Past Medical History:  Diagnosis Date   Asthma    Headache(784.0)    migranes   Hypertension    Rash Tuesday pm 09/26/11   left upper leg ...     PAST SURGICAL HISTORY: Past Surgical History:  Procedure Laterality Date   COLONOSCOPY     DILATION AND CURETTAGE  OF UTERUS     DISTAL ULNA EXCISION     ENDOMETRIAL ABLATION     HEEL SPUR SURGERY     JOINT REPLACEMENT  02/14/2011   knee rt   KNEE ARTHROSCOPY  2012   LUMBAR LAMINECTOMY/DECOMPRESSION MICRODISCECTOMY  09/28/2011   Procedure: LUMBAR LAMINECTOMY/DECOMPRESSION MICRODISCECTOMY;  Surgeon: Dahlia Bailiff;  Location: [facility];  Service: Orthopedics;  Laterality: Right;  RIGHT L3-4 DISCECTOMY   WRIST SURGERY      FAMILY HISTORY: The patient family history includes Congestive Heart Failure in her brother; Hypertension in her  brother, mother, and sister; Scleroderma in her father.  SOCIAL HISTORY:  The patient  reports that she has never smoked. She has never used smokeless tobacco. She reports that she does not drink alcohol and does not use drugs.  REVIEW OF SYSTEMS: Review of Systems  Constitutional: Negative for chills and fever.  HENT:  Negative for hoarse voice and nosebleeds.   Eyes:  Negative for discharge, double vision and pain.  Cardiovascular:  Positive for dyspnea on exertion (Improving). Negative for chest pain, claudication, leg swelling, near-syncope, orthopnea, palpitations, paroxysmal nocturnal dyspnea and syncope.  Respiratory:  Negative for hemoptysis and shortness of breath.   Musculoskeletal:  Negative for muscle cramps and myalgias.  Gastrointestinal:  Negative for abdominal pain, constipation, diarrhea, hematemesis, hematochezia, melena, nausea and vomiting.  Neurological:  Negative for dizziness and light-headedness.    PHYSICAL EXAM:    09/08/2022   10:05 AM 09/08/2022    9:25 AM 08/08/2021    2:26 PM  Vitals with BMI  Height  _0    Weight  132 lbs 13 oz   BMI  45.62   Systolic 563 893 734  Diastolic 78 81 87  Pulse 75 89 116   Physical Exam  Constitutional: No distress.  Age appropriate, hemodynamically stable.   Neck: No JVD present.  Cardiovascular: Normal rate, regular rhythm, S1 normal, S2 normal, intact distal pulses and normal pulses. Exam reveals no gallop, no S3 and no S4.  No murmur heard. Pulmonary/Chest: Effort normal and breath sounds normal. No stridor. She has no wheezes. She has no rales.  Abdominal: Soft. Bowel sounds are normal. She exhibits no distension. There is no abdominal tenderness.  Musculoskeletal:        General: No edema.     Cervical back: Neck supple.  Neurological: She is alert and oriented to person, place, and time. She has intact cranial nerves (2-12).  Skin: Skin is warm and moist.   CARDIAC DATABASE: EKG: 09/08/2022: Sinus  rhythm, 70 bpm, occasional PAC, LAE, nonspecific T wave abnormality.  Echocardiogram: 04/06/2021: Hyperdynamic LV systolic function with visual EF >70%. Left ventricle size is decreased. Moderate to severe left ventricular hypertrophy. Normal global wall motion. Normal diastolic filling pattern, normal LAP. Mild tricuspid regurgitation. No evidence of pulmonary hypertension. No prior study for comparison.   Stress Testing: No results found for this or any previous visit from the past 1095 days.  Heart Catheterization: Exercise Sestamibi stress test 06/06/2021: 1 Day Rest/Stress Protocol. Exercise time 2 minutes 9 seconds on Bruce protocol, achieved 4.6 METS, 106% of APMHR. Stress ECG nondiagnostic for ischemia due to baseline ST-T segments.  Normal myocardial perfusion without convincing evidence of reversible myocardial ischemia or prior infarct. LVEF gated SPECT 67% without regional wall motion abnormalities. Low risk study.  LABORATORY DATA:    Latest Ref Rng & Units 09/15/2021    3:47 PM 09/25/2011   11:50 AM 02/17/2011    4:31 AM  CBC  WBC 3.4 - 10.8 x10E3/uL 8.2  7.6  8.3   Hemoglobin 11.1 - 15.9 g/dL 12.5  13.7  11.3   Hematocrit 34.0 - 46.6 % 37.4  40.2  34.1   Platelets 150 - 450 x10E3/uL 364  342  301        Latest Ref Rng & Units 09/15/2021    3:47 PM 04/08/2021    9:59 AM 09/25/2011   11:50 AM  CMP  Glucose 70 - 99 mg/dL 125  107  115   BUN 8 - 27 mg/dL _0 Creatinine 0.57 - 1.00 mg/dL 0.69  0.72  0.54   Sodium 134 - 144 mmol/L 140  141  140   Potassium 3.5 - 5.2 mmol/L 3.5  3.9  3.7   Chloride 96 - 106 mmol/L 97  97  97   CO2 20 - 29 mmol/L _1 Calcium 8.7 - 10.3 mg/dL 10.1  10.4  10.7     Lipid Panel  No results found for: "CHOL", "TRIG", "HDL", "CHOLHDL", "VLDL", "LDLCALC", "LDLDIRECT", "LABVLDL"  No components found for: "NTPROBNP" No results for input(s): "PROBNP" in the last 8760 hours.  No results for input(s): "TSH" in the last  8760 hours.  BMP Recent Labs    09/15/21 1547  NA 140  K 3.5  CL 97  CO2 27  GLUCOSE 125*  BUN 13  CREATININE 0.69  CALCIUM 10.1    HEMOGLOBIN A1C No results found for: "HGBA1C", "MPG"  External Labs:  Date Collected: 01/11/2021 , information obtained by referring physician Potassium: 3.6 Creatinine 0.59 mg/dL. eGFR: 111 mL/min per 1.73 m Hemoglobin: 13.0 g/dL and hematocrit: 40.2 % AST: 16 , ALT: 11 , alkaline phosphatase: 49  D-dimers 0.36 (within normal limits)  IMPRESSION:    ICD-10-CM   1. Benign hypertension  I10 EKG 12-Lead    MR CARDIAC MORPHOLOGY W WO CONTRAST    CBC    Basic metabolic panel    2. Dyspnea on exertion  R06.09 EKG 12-Lead    3. Left ventricular hypertrophy  I51.7 EKG 12-Lead    MR CARDIAC MORPHOLOGY W WO CONTRAST    CBC    Basic metabolic panel       RECOMMENDATIONS: PENELOPE FITTRO is a 66 y.o. female whose past medical history and cardiac risk factors include: HTN, asthma.   Benign hypertension Much improved. Home blood pressures according to the patient around 120-134 mmHg Continue current medical therapy. Reemphasized importance of a low-salt diet. No changes warranted at this time.  Dyspnea on exertion / Left ventricular hypertrophy In the past given her symptoms of dyspnea on exertion, echocardiogram noted hyper dynamic LV, and given the underlying left ventricular hypertrophy shared decision was to proceed with a cardiac MRI to rule out infiltrative cardiomyopathy or HCM.  She likely has hypertensive heart disease but other possibilities cannot be ruled out.  Cardiac MRI is still pending, will reorder it.  She is asked to schedule the MRI first and after doing so to have labs done.  I would like to see her back in 3 months or sooner after the cardiac MRI if no additional workup is warranted at that time we will continue her annual follow-up visits.  FINAL MEDICATION LIST END OF ENCOUNTER: No orders of the defined types  were placed in this encounter.     There are no discontinued medications.    Current Outpatient Medications:  albuterol (VENTOLIN HFA) 108 (90 Base) MCG/ACT inhaler, Inhale 1-2 puffs into the lungs every 4 (four) hours as needed., Disp: , Rfl:    cetirizine (ZYRTEC) 10 MG tablet, Take 10 mg by mouth daily as needed for allergies., Disp: , Rfl:    diltiazem (CARDIZEM CD) 120 MG 24 hr capsule, Take 1 capsule (120 mg total) by mouth every morning., Disp: 30 capsule, Rfl: 0   fexofenadine (ALLEGRA) 180 MG tablet, Take 180 mg by mouth daily as needed., Disp: , Rfl:    fluticasone (FLONASE) 50 MCG/ACT nasal spray, Place into both nostrils daily., Disp: , Rfl:    fluticasone (FLOVENT HFA) 110 MCG/ACT inhaler, Inhale into the lungs 2 (two) times daily., Disp: , Rfl:    hydrochlorothiazide (HYDRODIURIL) 25 MG tablet, TAKE 1 TABLET BY MOUTH EVERY DAY IN THE MORNING, Disp: 90 tablet, Rfl: 1   hydrOXYzine (ATARAX/VISTARIL) 25 MG tablet, Take 25 mg by mouth every 8 (eight) hours., Disp: , Rfl:    latanoprost (XALATAN) 0.005 % ophthalmic solution, Place 1 drop into both eyes daily at 12 noon., Disp: , Rfl:    valsartan (DIOVAN) 320 MG tablet, TAKE 1 TABLET BY MOUTH EVERY DAY IN THE EVENING, Disp: 90 tablet, Rfl: 1  Orders Placed This Encounter  Procedures   MR CARDIAC MORPHOLOGY W WO CONTRAST   CBC   Basic metabolic panel   EKG 72-BMSX     There are no Patient Instructions on file for this visit.   --Continue cardiac medications as reconciled in final medication list. --Return in about 3 months (around 12/08/2022) for Follow  BP and discuss MRI results . Or sooner if needed. --Continue follow-up with your primary care physician regarding the management of your other chronic comorbid conditions.  Patient's questions and concerns were addressed to her satisfaction. She voices understanding of the instructions provided during this encounter.   This note was created using a voice recognition  software as a result there may be grammatical errors inadvertently enclosed that do not reflect the nature of this encounter. Every attempt is made to correct such errors.  Rex Kras, [place], [facility]  Pager: [phone] Office: [phone]

## 2022-10-09 ENCOUNTER — Other Ambulatory Visit: Payer: Self-pay

## 2022-10-09 DIAGNOSIS — I10 Essential (primary) hypertension: Secondary | ICD-10-CM

## 2022-10-09 MED ORDER — DILTIAZEM HCL ER COATED BEADS 120 MG PO CP24: 120.0000 mg | ORAL_CAPSULE | Freq: Every morning | ORAL | 3 refills | Status: DC

## 2022-11-29 ENCOUNTER — Other Ambulatory Visit: Payer: Self-pay | Admitting: Cardiology

## 2022-11-29 DIAGNOSIS — I10 Essential (primary) hypertension: Secondary | ICD-10-CM

## 2022-12-07 ENCOUNTER — Ambulatory Visit: Payer: BC Managed Care – PPO | Admitting: Cardiology

## 2022-12-21 ENCOUNTER — Ambulatory Visit: Payer: BC Managed Care – PPO | Admitting: Cardiology

## 2022-12-21 ENCOUNTER — Encounter: Payer: Self-pay | Admitting: Cardiology

## 2022-12-21 VITALS — BP 150/74 | HR 90 | Resp 16 | Ht 64.0 in | Wt 139.8 lb

## 2022-12-21 DIAGNOSIS — R0609 Other forms of dyspnea: Secondary | ICD-10-CM

## 2022-12-21 DIAGNOSIS — I517 Cardiomegaly: Secondary | ICD-10-CM

## 2022-12-21 DIAGNOSIS — I10 Essential (primary) hypertension: Secondary | ICD-10-CM

## 2022-12-21 MED ORDER — CHLORTHALIDONE 25 MG PO TABS: 25.0000 mg | ORAL_TABLET | Freq: Every morning | ORAL | 0 refills | Status: DC

## 2022-12-21 NOTE — Progress Notes (Signed)
Date:  12/21/2022   ID:  Monique Kirby, DOB 12-May-1956, MRN [id number]  PCP:  Pcp, No  Cardiologist:  Rex Kras, DO, [place] (established care 04/01/2021)  Date: 12/21/22 Last Office Visit: 09/08/2022  Chief Complaint  Patient presents with   Hypertension   Follow-up    3 months and test results    HPI  Monique Kirby is a 68 y.o. female whose past medical history and cardiovascular risk factors include: HTN, asthma.  Referred to practice for evaluation of dyspnea on exertion.  It was felt that her dyspnea on exertion was likely secondary to uncontrolled hypertension.  Her antihypertensive medications were uptitrated and her echocardiogram noted hyperdynamic LVEF along with LVH and raising the suspicion for possible infiltrative cardiomyopathy/HCM.  Cardiac MRI was recommended to her back in 2022 but still is pending.  Since last office visit patient states that she is doing well overall control not present ranging to 130-140 mmHg.  ALLERGIES: Allergies  Allergen Reactions   Shrimp [Shellfish Allergy] Anaphylaxis   Geralyn Flash [Fish Allergy] Anaphylaxis and Swelling   Metoprolol Hives   Amoxicillin-Pot Clavulanate    Aspirin Other (See Comments)    Makes pt sick   Feldene [Piroxicam] Swelling   Food Other (See Comments)    Zucchini- mouth swells   Levofloxacin Hives   Levofloxacin    Lodine [Etodolac] Hives   Neo-Bacit-Poly-Lidocaine    Sulfur     Other reaction(s): Unknown   Tape    Latex Itching and Rash   Neosporin [Neomycin-Bacitracin Zn-Polymyx] Rash    MEDICATION LIST PRIOR TO VISIT: Current Meds  Medication Sig   albuterol (VENTOLIN HFA) 108 (90 Base) MCG/ACT inhaler Inhale 1-2 puffs into the lungs every 4 (four) hours as needed.   cetirizine (ZYRTEC) 10 MG tablet Take 10 mg by mouth daily as needed for allergies.   chlorthalidone (HYGROTON) 25 MG tablet Take 1 tablet (25 mg total) by mouth every morning.   diltiazem (CARDIZEM CD) 120 MG 24 hr capsule  Take 1 capsule (120 mg total) by mouth every morning.   fexofenadine (ALLEGRA) 180 MG tablet Take 180 mg by mouth daily as needed.   fluticasone (FLONASE) 50 MCG/ACT nasal spray Place into both nostrils daily.   fluticasone (FLOVENT HFA) 110 MCG/ACT inhaler Inhale into the lungs 2 (two) times daily.   hydrOXYzine (ATARAX/VISTARIL) 25 MG tablet Take 25 mg by mouth every 8 (eight) hours.   latanoprost (XALATAN) 0.005 % ophthalmic solution Place 1 drop into both eyes daily at 12 noon.   valsartan (DIOVAN) 320 MG tablet TAKE 1 TABLET BY MOUTH EVERY DAY IN THE EVENING   [DISCONTINUED] hydrochlorothiazide (HYDRODIURIL) 25 MG tablet TAKE 1 TABLET BY MOUTH EVERY DAY IN THE MORNING     PAST MEDICAL HISTORY: Past Medical History:  Diagnosis Date   Asthma    Headache(784.0)    migranes   Hypertension    Rash Tuesday pm 09/26/11   left upper leg ...     PAST SURGICAL HISTORY: Past Surgical History:  Procedure Laterality Date   COLONOSCOPY     DILATION AND CURETTAGE OF UTERUS     DISTAL ULNA EXCISION     ENDOMETRIAL ABLATION     HEEL SPUR SURGERY     JOINT REPLACEMENT  02/14/2011   knee rt   KNEE ARTHROSCOPY  2012   LUMBAR LAMINECTOMY/DECOMPRESSION MICRODISCECTOMY  09/28/2011   Procedure: LUMBAR LAMINECTOMY/DECOMPRESSION MICRODISCECTOMY;  Surgeon: Dahlia Bailiff;  Location: [facility];  Service: Orthopedics;  Laterality: Right;  RIGHT L3-4 DISCECTOMY   WRIST SURGERY      FAMILY HISTORY: The patient family history includes Congestive Heart Failure in her brother; Hypertension in her brother, mother, and sister; Scleroderma in her father.  SOCIAL HISTORY:  The patient  reports that she has never smoked. She has never used smokeless tobacco. She reports that she does not drink alcohol and does not use drugs.  REVIEW OF SYSTEMS: Review of Systems  Constitutional: Negative for chills and fever.  HENT:  Negative for hoarse voice and nosebleeds.   Eyes:  Negative for discharge, double vision and  pain.  Cardiovascular:  Positive for dyspnea on exertion (better). Negative for chest pain, claudication, leg swelling, near-syncope, orthopnea, palpitations, paroxysmal nocturnal dyspnea and syncope.  Respiratory:  Negative for hemoptysis and shortness of breath.   Musculoskeletal:  Positive for joint pain. Negative for muscle cramps and myalgias.  Gastrointestinal:  Negative for abdominal pain, constipation, diarrhea, hematemesis, hematochezia, melena, nausea and vomiting.  Neurological:  Negative for dizziness and light-headedness.    PHYSICAL EXAM:    12/21/2022    3:45 PM 09/08/2022   10:05 AM 09/08/2022    9:25 AM  Vitals with BMI  Height 5\' 4"   5\' 4"   Weight 139 lbs 13 oz  132 lbs 13 oz  BMI Q000111Q  99991111  Systolic Q000111Q 0000000 Q000111Q  Diastolic 74 78 81  Pulse 90 75 89   Physical Exam  Constitutional: No distress.  Age appropriate, hemodynamically stable.   Neck: No JVD present.  Cardiovascular: Normal rate, regular rhythm, S1 normal, S2 normal, intact distal pulses and normal pulses. Exam reveals no gallop, no S3 and no S4.  No murmur heard. Pulmonary/Chest: Effort normal and breath sounds normal. No stridor. She has no wheezes. She has no rales.  Abdominal: Soft. Bowel sounds are normal. She exhibits no distension. There is no abdominal tenderness.  Musculoskeletal:        General: No edema.     Cervical back: Neck supple.  Neurological: She is alert and oriented to person, place, and time. She has intact cranial nerves (2-12).  Skin: Skin is warm and moist.   CARDIAC DATABASE: EKG: 09/08/2022: Sinus rhythm, 70 bpm, occasional PAC, LAE, nonspecific T wave abnormality.  Echocardiogram: 04/06/2021: Hyperdynamic LV systolic function with visual EF >70%. Left ventricle size is decreased. Moderate to severe left ventricular hypertrophy. Normal global wall motion. Normal diastolic filling pattern, normal LAP. Mild tricuspid regurgitation. No evidence of pulmonary hypertension. No  prior study for comparison.   Stress Testing: No results found for this or any previous visit from the past 1095 days.  Heart Catheterization: Exercise Sestamibi stress test 06/06/2021: 1 Day Rest/Stress Protocol. Exercise time 2 minutes 9 seconds on Bruce protocol, achieved 4.6 METS, 106% of APMHR. Stress ECG nondiagnostic for ischemia due to baseline ST-T segments.  Normal myocardial perfusion without convincing evidence of reversible myocardial ischemia or prior infarct. LVEF gated SPECT 67% without regional wall motion abnormalities. Low risk study.  LABORATORY DATA:    Latest Ref Rng & Units 09/15/2021    3:47 PM 09/25/2011   11:50 AM 02/17/2011    4:31 AM  CBC  WBC 3.4 - 10.8 x10E3/uL 8.2  7.6  8.3   Hemoglobin 11.1 - 15.9 g/dL 12.5  13.7  11.3   Hematocrit 34.0 - 46.6 % 37.4  40.2  34.1   Platelets 150 - 450 x10E3/uL 364  342  301        Latest Ref Rng & Units  09/15/2021    3:47 PM 04/08/2021    9:59 AM 09/25/2011   11:50 AM  CMP  Glucose 70 - 99 mg/dL 125  107  115   BUN 8 - 27 mg/dL 13  12  13    Creatinine 0.57 - 1.00 mg/dL 0.69  0.72  0.54   Sodium 134 - 144 mmol/L 140  141  140   Potassium 3.5 - 5.2 mmol/L 3.5  3.9  3.7   Chloride 96 - 106 mmol/L 97  97  97   CO2 20 - 29 mmol/L 27  24  30    Calcium 8.7 - 10.3 mg/dL 10.1  10.4  10.7     Lipid Panel  No results found for: "CHOL", "TRIG", "HDL", "CHOLHDL", "VLDL", "LDLCALC", "LDLDIRECT", "LABVLDL"  No components found for: "NTPROBNP" No results for input(s): "PROBNP" in the last 8760 hours.  No results for input(s): "TSH" in the last 8760 hours.  BMP No results for input(s): "NA", "K", "CL", "CO2", "GLUCOSE", "BUN", "CREATININE", "CALCIUM", "GFRNONAA", "GFRAA" in the last 8760 hours.   HEMOGLOBIN A1C No results found for: "HGBA1C", "MPG"  External Labs:  Date Collected: 01/11/2021 , information obtained by referring physician Potassium: 3.6 Creatinine 0.59 mg/dL. eGFR: 111 mL/min per 1.73 m Hemoglobin:  13.0 g/dL and hematocrit: 40.2 % AST: 16 , ALT: 11 , alkaline phosphatase: 49  D-dimers 0.36 (within normal limits)  IMPRESSION:    ICD-10-CM   1. Benign hypertension  I10 chlorthalidone (HYGROTON) 25 MG tablet    Basic metabolic panel    2. Dyspnea on exertion  R06.09     3. Left ventricular hypertrophy  I51.7        RECOMMENDATIONS: VYLETTE WORK is a 67 y.o. female whose past medical history and cardiac risk factors include: HTN, asthma.   Benign hypertension Improving. Currently taking diltiazem and HCTZ in the morning and valsartan at night. Will discontinue hydrochlorothiazide 25 mg p.o. every morning. Transition her to chlorthalidone 25 mg p.o. every morning Of note both HCTZ and chlorthalidone have sulfur and patient is aware of this given her allergies.  But is hopeful that she will be able to tolerate chlorthalidone as she did okay with HCTZ. Labs in 1 week to evaluate kidney function  Dyspnea on exertion / Left ventricular hypertrophy Requested her to have a cardiac MRI to evaluate for possible hypertensive heart disease versus true cardiomyopathy versus HCM. However, has not had a chance to get cardiac MRI for some time now. Patient stated she will make an effort to have it done prior to next office visit.  If cardiac MRI is within acceptable limits would recommend annual follow-up visits and/or as needed basis.  FINAL MEDICATION LIST END OF ENCOUNTER: Meds ordered this encounter  Medications   chlorthalidone (HYGROTON) 25 MG tablet    Sig: Take 1 tablet (25 mg total) by mouth every morning.    Dispense:  30 tablet    Refill:  0   Medications Discontinued During This Encounter  Medication Reason   hydrochlorothiazide (HYDRODIURIL) 25 MG tablet Change in therapy     Current Outpatient Medications:    albuterol (VENTOLIN HFA) 108 (90 Base) MCG/ACT inhaler, Inhale 1-2 puffs into the lungs every 4 (four) hours as needed., Disp: , Rfl:    cetirizine  (ZYRTEC) 10 MG tablet, Take 10 mg by mouth daily as needed for allergies., Disp: , Rfl:    chlorthalidone (HYGROTON) 25 MG tablet, Take 1 tablet (25 mg total) by mouth every morning., Disp:  30 tablet, Rfl: 0   diltiazem (CARDIZEM CD) 120 MG 24 hr capsule, Take 1 capsule (120 mg total) by mouth every morning., Disp: 90 capsule, Rfl: 3   fexofenadine (ALLEGRA) 180 MG tablet, Take 180 mg by mouth daily as needed., Disp: , Rfl:    fluticasone (FLONASE) 50 MCG/ACT nasal spray, Place into both nostrils daily., Disp: , Rfl:    fluticasone (FLOVENT HFA) 110 MCG/ACT inhaler, Inhale into the lungs 2 (two) times daily., Disp: , Rfl:    hydrOXYzine (ATARAX/VISTARIL) 25 MG tablet, Take 25 mg by mouth every 8 (eight) hours., Disp: , Rfl:    latanoprost (XALATAN) 0.005 % ophthalmic solution, Place 1 drop into both eyes daily at 12 noon., Disp: , Rfl:    valsartan (DIOVAN) 320 MG tablet, TAKE 1 TABLET BY MOUTH EVERY DAY IN THE EVENING, Disp: 90 tablet, Rfl: 1  Orders Placed This Encounter  Procedures   Basic metabolic panel    There are no Patient Instructions on file for this visit.   --Continue cardiac medications as reconciled in final medication list. --Return in about 3 months (around 03/22/2023) for Follow up, BP,s/p cardiac MRI. Or sooner if needed. --Continue follow-up with your primary care physician regarding the management of your other chronic comorbid conditions.  Patient's questions and concerns were addressed to her satisfaction. She voices understanding of the instructions provided during this encounter.   This note was created using a voice recognition software as a result there may be grammatical errors inadvertently enclosed that do not reflect the nature of this encounter. Every attempt is made to correct such errors.  Rex Kras, [place], [facility]  Pager:  [phone] Office: [phone]

## 2022-12-27 ENCOUNTER — Other Ambulatory Visit: Payer: Self-pay | Admitting: Cardiology

## 2022-12-28 LAB — BASIC METABOLIC PANEL
BUN/Creatinine Ratio: 23 (ref 12–28)
BUN: 13 mg/dL (ref 8–27)
CO2: 27 mmol/L (ref 20–29)
Calcium: 10.1 mg/dL (ref 8.7–10.3)
Chloride: 95 mmol/L — ABNORMAL LOW (ref 96–106)
Creatinine, Ser: 0.57 mg/dL (ref 0.57–1.00)
Glucose: 118 mg/dL — ABNORMAL HIGH (ref 70–99)
Potassium: 3.5 mmol/L (ref 3.5–5.2)
Sodium: 137 mmol/L (ref 134–144)
eGFR: 100 mL/min/{1.73_m2} (ref >59)

## 2023-01-05 ENCOUNTER — Other Ambulatory Visit: Payer: Self-pay | Admitting: Cardiology

## 2023-01-05 DIAGNOSIS — I10 Essential (primary) hypertension: Secondary | ICD-10-CM

## 2023-01-12 ENCOUNTER — Other Ambulatory Visit: Payer: Self-pay | Admitting: Cardiology

## 2023-01-12 DIAGNOSIS — I10 Essential (primary) hypertension: Secondary | ICD-10-CM

## 2023-03-30 ENCOUNTER — Ambulatory Visit: Payer: BC Managed Care – PPO | Admitting: Cardiology

## 2023-04-13 ENCOUNTER — Ambulatory Visit: Payer: BC Managed Care – PPO | Admitting: Cardiology

## 2023-04-18 ENCOUNTER — Telehealth: Payer: Self-pay

## 2023-04-18 ENCOUNTER — Other Ambulatory Visit: Payer: Self-pay

## 2023-04-18 MED ORDER — ISOSORB DINITRATE-HYDRALAZINE 20-37.5 MG PO TABS: 1.0000 | ORAL_TABLET | Freq: Three times a day (TID) | ORAL | 0 refills | Status: DC

## 2023-04-18 NOTE — Telephone Encounter (Signed)
Medication sent in. 

## 2023-04-18 NOTE — Telephone Encounter (Signed)
Given her reaction to chlorthalidone recommended discontinuation. Will start BiDil 20/37.5 mg p.o. 3 times daily.  Monique Kirby [place], DO, [facility]

## 2023-04-18 NOTE — Telephone Encounter (Signed)
Patient called and stated that she was recently prescribed CHLORTHALIDONE  and she had an allergic reaction to the medication which include itching in legs and arms, rash on neck, and face and mouth swelling and and was still having leg swelling. She does not have a way to check her BP's.

## 2023-04-23 ENCOUNTER — Telehealth: Payer: Self-pay

## 2023-04-23 NOTE — Telephone Encounter (Signed)
What are her home BP reading?  Hadia Minier [place], DO, [facility]

## 2023-04-25 NOTE — Telephone Encounter (Signed)
Yes, that is fine.  Will see her after the cMRI or sooner if needed.   Dakin Madani Rochelle, DO, [facility]

## 2023-04-25 NOTE — Telephone Encounter (Signed)
Patient mention she has not checked her bp at home. Patient wants to know if she can go back to the hydrochlorothiazide (HYDRODIURIL) 25 MG

## 2023-04-25 NOTE — Telephone Encounter (Signed)
I have send this message on his mychart

## 2023-05-08 ENCOUNTER — Ambulatory Visit: Payer: BC Managed Care – PPO | Admitting: Cardiology

## 2023-05-08 ENCOUNTER — Encounter: Payer: Self-pay | Admitting: Cardiology

## 2023-05-08 VITALS — BP 170/89 | HR 98 | Resp 16 | Ht 64.0 in | Wt 139.0 lb

## 2023-05-08 DIAGNOSIS — R0609 Other forms of dyspnea: Secondary | ICD-10-CM

## 2023-05-08 DIAGNOSIS — I10 Essential (primary) hypertension: Secondary | ICD-10-CM

## 2023-05-08 DIAGNOSIS — I517 Cardiomegaly: Secondary | ICD-10-CM

## 2023-05-08 MED ORDER — TORSEMIDE 10 MG PO TABS: 10.0000 mg | ORAL_TABLET | ORAL | 0 refills | Status: DC

## 2023-05-08 MED ORDER — CLONIDINE HCL 0.1 MG PO TABS
0.1000 mg | ORAL_TABLET | Freq: Two times a day (BID) | ORAL | 0 refills | Status: DC
Start: 2023-05-08 — End: 2023-07-10

## 2023-05-08 NOTE — Progress Notes (Unsigned)
ID:  Monique Kirby, DOB 1955-11-25, MRN [id number]  PCP:  Pcp, No  Cardiologist:  Tessa Lerner, DO, FACC (established care 04/01/2021)  Date: 05/08/23 Last Office Visit: 09/08/2022  Chief Complaint  Patient presents with   Hypertension   Follow-up    HPI  Monique Kirby is a 67 y.o. female whose past medical history and cardiovascular risk factors include: HTN, asthma.  Referred to practice for evaluation of dyspnea on exertion.  It was felt that her dyspnea on exertion was likely secondary to uncontrolled hypertension.  Her antihypertensive medications were uptitrated and her echocardiogram noted hyperdynamic LVEF along with LVH and raising the suspicion for possible infiltrative cardiomyopathy/HCM.  Cardiac MRI was recommended to her back in 2022 but still is pending.  Now tentatively scheduled for October 2024.  She presents today for follow-up for blood pressure management.  At last visit she was started on chlorthalidone and did well for short period of time and her blood pressures on medical therapy was between 130-140 mmHg.  However patient states that she developed hives and felt as if her throat was closing and required Benadryl use and therefore the medication was discontinued.  She was later transitioned to BiDil which was also stopped secondary to generalized body ache/pain.  She does not check her blood pressures at home currently.  She denies any headaches, back pain, syncope, no significant change in urine output.  ALLERGIES: Allergies  Allergen Reactions   Shrimp [Shellfish Allergy] Anaphylaxis   [place] Gum [Fish Allergy] Anaphylaxis and Swelling   Metoprolol Hives   Amoxicillin-Pot Clavulanate    Aspirin Other (See Comments)    Makes pt sick   Bidil [Isosorb Dinitrate-Hydralazine]     Entire body hurting   Chlorthalidone Other (See Comments)    Hives, itching, throat closing requiring Benadryl use   Feldene [Piroxicam] Swelling   Food Other (See Comments)     Zucchini- mouth swells   Levofloxacin Hives   Levofloxacin    Lodine [Etodolac] Hives   Neo-Bacit-Poly-Lidocaine    Sulfur     Other reaction(s): Unknown   Tape    Latex Itching and Rash   Neosporin [Neomycin-Bacitracin Zn-Polymyx] Rash    MEDICATION LIST PRIOR TO VISIT: Current Meds  Medication Sig   albuterol (VENTOLIN HFA) 108 (90 Base) MCG/ACT inhaler Inhale 1-2 puffs into the lungs every 4 (four) hours as needed.   cetirizine (ZYRTEC) 10 MG tablet Take 10 mg by mouth daily as needed for allergies.   cloNIDine (CATAPRES) 0.1 MG tablet Take 1 tablet (0.1 mg total) by mouth 2 (two) times daily.   diltiazem (CARDIZEM CD) 120 MG 24 hr capsule Take 1 capsule (120 mg total) by mouth every morning.   fexofenadine (ALLEGRA) 180 MG tablet Take 180 mg by mouth daily as needed.   fluticasone (FLONASE) 50 MCG/ACT nasal spray Place into both nostrils daily.   fluticasone (FLOVENT HFA) 110 MCG/ACT inhaler Inhale into the lungs 2 (two) times daily.   hydrOXYzine (ATARAX/VISTARIL) 25 MG tablet Take 25 mg by mouth every 8 (eight) hours.   latanoprost (XALATAN) 0.005 % ophthalmic solution Place 1 drop into both eyes daily at 12 noon.   torsemide (DEMADEX) 10 MG tablet Take 1 tablet (10 mg total) by mouth every morning for 7 days.   valsartan (DIOVAN) 320 MG tablet TAKE 1 TABLET BY MOUTH EVERY DAY IN THE EVENING     PAST MEDICAL HISTORY: Past Medical History:  Diagnosis Date   Asthma    Headache(784.0)  migranes   Hypertension    Rash Tuesday pm 09/26/11   left upper leg ...     PAST SURGICAL HISTORY: Past Surgical History:  Procedure Laterality Date   COLONOSCOPY     DILATION AND CURETTAGE OF UTERUS     DISTAL ULNA EXCISION     ENDOMETRIAL ABLATION     HEEL SPUR SURGERY     JOINT REPLACEMENT  02/14/2011   knee rt   KNEE ARTHROSCOPY  2012   LUMBAR LAMINECTOMY/DECOMPRESSION MICRODISCECTOMY  09/28/2011   Procedure: LUMBAR LAMINECTOMY/DECOMPRESSION MICRODISCECTOMY;  Surgeon: Alvy Beal;  Location: [facility] OR;  Service: Orthopedics;  Laterality: Right;  RIGHT L3-4 DISCECTOMY   WRIST SURGERY      FAMILY HISTORY: The patient family history includes Congestive Heart Failure in her brother; Hypertension in her brother, mother, and sister; Scleroderma in her father.  SOCIAL HISTORY:  The patient  reports that she has never smoked. She has never used smokeless tobacco. She reports that she does not drink alcohol and does not use drugs.  REVIEW OF SYSTEMS: Review of Systems  Cardiovascular:  Positive for dyspnea on exertion. Negative for chest pain, claudication, irregular heartbeat, leg swelling, near-syncope, orthopnea, palpitations, paroxysmal nocturnal dyspnea and syncope.  Respiratory:  Negative for shortness of breath.   Hematologic/Lymphatic: Negative for bleeding problem.  Musculoskeletal:  Positive for joint pain. Negative for muscle cramps and myalgias.  Neurological:  Negative for dizziness and light-headedness.    PHYSICAL EXAM:    05/08/2023    4:18 PM 05/08/2023    3:40 PM 05/08/2023    3:36 PM  Vitals with BMI  Height   5\' 4"   Weight   139 lbs  BMI   23.85  Systolic 170 187 782  Diastolic 89 94 93  Pulse 98 109 104   Physical Exam  Constitutional: No distress.  Age appropriate, hemodynamically stable.   Neck: No JVD present.  Cardiovascular: Normal rate, regular rhythm, S1 normal, S2 normal, intact distal pulses and normal pulses. Exam reveals no gallop, no S3 and no S4.  No murmur heard. Pulmonary/Chest: Effort normal and breath sounds normal. No stridor. She has no wheezes. She has no rales.  Abdominal: Soft. Bowel sounds are normal. She exhibits no distension. There is no abdominal tenderness.  Musculoskeletal:        General: No edema.     Cervical back: Neck supple.  Neurological: She is alert and oriented to person, place, and time. She has intact cranial nerves (2-12).  Skin: Skin is warm and moist.   CARDIAC DATABASE: EKG: 05/08/2023:  Sinus tachycardia, 106 bpm, LAE, nonspecific ST-T changes.  Echocardiogram: 04/06/2021: Hyperdynamic LV systolic function with visual EF >70%. Left ventricle size is decreased. Moderate to severe left ventricular hypertrophy. Normal global wall motion. Normal diastolic filling pattern, normal LAP. Mild tricuspid regurgitation. No evidence of pulmonary hypertension. No prior study for comparison.   Stress Testing: No results found for this or any previous visit from the past 1095 days.  Heart Catheterization: Exercise Sestamibi stress test 06/06/2021: 1 Day Rest/Stress Protocol. Exercise time 2 minutes 9 seconds on Bruce protocol, achieved 4.6 METS, 106% of APMHR. Stress ECG nondiagnostic for ischemia due to baseline ST-T segments.  Normal myocardial perfusion without convincing evidence of reversible myocardial ischemia or prior infarct. LVEF gated SPECT 67% without regional wall motion abnormalities. Low risk study.  LABORATORY DATA:    Latest Ref Rng & Units 09/15/2021    3:47 PM 09/25/2011   11:50 AM 02/17/2011  4:31 AM  CBC  WBC 3.4 - 10.8 x10E3/uL 8.2  7.6  8.3   Hemoglobin 11.1 - 15.9 g/dL 86.5  78.4  69.6   Hematocrit 34.0 - 46.6 % 37.4  40.2  34.1   Platelets 150 - 450 x10E3/uL 364  342  301        Latest Ref Rng & Units 12/27/2022    8:39 AM 09/15/2021    3:47 PM 04/08/2021    9:59 AM  CMP  Glucose 70 - 99 mg/dL 295  284  132   BUN 8 - 27 mg/dL 13  13  12    Creatinine 0.57 - 1.00 mg/dL 4.40  1.02  7.25   Sodium 134 - 144 mmol/L 137  140  141   Potassium 3.5 - 5.2 mmol/L 3.5  3.5  3.9   Chloride 96 - 106 mmol/L 95  97  97   CO2 20 - 29 mmol/L 27  27  24    Calcium 8.7 - 10.3 mg/dL 36.6  44.0  34.7     Lipid Panel  No results found for: "CHOL", "TRIG", "HDL", "CHOLHDL", "VLDL", "LDLCALC", "LDLDIRECT", "LABVLDL"  No components found for: "NTPROBNP" No results for input(s): "PROBNP" in the last 8760 hours.  No results for input(s): "TSH" in the last 8760  hours.  BMP Recent Labs    12/27/22 0839  NA 137  K 3.5  CL 95*  CO2 27  GLUCOSE 118*  BUN 13  CREATININE 0.57  CALCIUM 10.1     HEMOGLOBIN A1C No results found for: "HGBA1C", "MPG"  External Labs:  Date Collected: 01/11/2021 , information obtained by referring physician Potassium: 3.6 Creatinine 0.59 mg/dL. eGFR: 111 mL/min per 1.73 m Hemoglobin: 13.0 g/dL and hematocrit: 42.5 % AST: 16 , ALT: 11 , alkaline phosphatase: 49  D-dimers 0.36 (within normal limits)  IMPRESSION:    ICD-10-CM   1. Benign hypertension  I10 EKG 12-Lead    torsemide (DEMADEX) 10 MG tablet    Basic metabolic panel    cloNIDine (CATAPRES) 0.1 MG tablet    VAS US RENAL ARTERY DUPLEX    AMB REFERRAL TO ADVANCED HTN CLINIC    2. Dyspnea on exertion  R06.09     3. Left ventricular hypertrophy  I51.7        RECOMMENDATIONS: Monique Kirby is a 67 y.o. female whose past medical history and cardiac risk factors include: HTN, asthma.   Benign hypertension Office blood pressures are not well-controlled. Home blood pressures are not checked Was transitioned from HCTZ to chlorthalidone.  Chlorthalidone was discontinued due to hives/itching/throat closing. In the interim was started on BiDil which was stopped secondary to generalized body ache/hurting. She has bilateral lower extremity swelling and therefore we will start 7 days of torsemide and recheck BMP in week. Considered labetalol however if she is noted to be allergic to metoprolol.  And therefore we will hold off. Start clonidine 0.1 mg p.o. twice daily Renal duplex to evaluate for renal artery stenosis. Will refer her to ambulatory blood pressure clinic with Dr. Duke Salvia for reevaluation/guidance. Patient is aware of symptoms of aortic dissection and if present she is asked to go to the closest ER via EMS.  Dyspnea on exertion / Left ventricular hypertrophy Requested her to have a cardiac MRI to evaluate for possible hypertensive  heart disease versus true cardiomyopathy versus HCM. However, has not had a chance to get cardiac MRI for some time now. MRI now scheduled for October 2024.  FINAL  MEDICATION LIST END OF ENCOUNTER: Meds ordered this encounter  Medications   torsemide (DEMADEX) 10 MG tablet    Sig: Take 1 tablet (10 mg total) by mouth every morning for 7 days.    Dispense:  7 tablet    Refill:  0   cloNIDine (CATAPRES) 0.1 MG tablet    Sig: Take 1 tablet (0.1 mg total) by mouth 2 (two) times daily.    Dispense:  180 tablet    Refill:  0   Medications Discontinued During This Encounter  Medication Reason   chlorthalidone (HYGROTON) 25 MG tablet    isosorbide-hydrALAZINE (BIDIL) 20-37.5 MG tablet      Current Outpatient Medications:    albuterol (VENTOLIN HFA) 108 (90 Base) MCG/ACT inhaler, Inhale 1-2 puffs into the lungs every 4 (four) hours as needed., Disp: , Rfl:    cetirizine (ZYRTEC) 10 MG tablet, Take 10 mg by mouth daily as needed for allergies., Disp: , Rfl:    cloNIDine (CATAPRES) 0.1 MG tablet, Take 1 tablet (0.1 mg total) by mouth 2 (two) times daily., Disp: 180 tablet, Rfl: 0   diltiazem (CARDIZEM CD) 120 MG 24 hr capsule, Take 1 capsule (120 mg total) by mouth every morning., Disp: 90 capsule, Rfl: 3   fexofenadine (ALLEGRA) 180 MG tablet, Take 180 mg by mouth daily as needed., Disp: , Rfl:    fluticasone (FLONASE) 50 MCG/ACT nasal spray, Place into both nostrils daily., Disp: , Rfl:    fluticasone (FLOVENT HFA) 110 MCG/ACT inhaler, Inhale into the lungs 2 (two) times daily., Disp: , Rfl:    hydrOXYzine (ATARAX/VISTARIL) 25 MG tablet, Take 25 mg by mouth every 8 (eight) hours., Disp: , Rfl:    latanoprost (XALATAN) 0.005 % ophthalmic solution, Place 1 drop into both eyes daily at 12 noon., Disp: , Rfl:    torsemide (DEMADEX) 10 MG tablet, Take 1 tablet (10 mg total) by mouth every morning for 7 days., Disp: 7 tablet, Rfl: 0   valsartan (DIOVAN) 320 MG tablet, TAKE 1 TABLET BY MOUTH EVERY  DAY IN THE EVENING, Disp: 90 tablet, Rfl: 1  Orders Placed This Encounter  Procedures   Basic metabolic panel   AMB REFERRAL TO ADVANCED HTN CLINIC   EKG 12-Lead   VAS US RENAL ARTERY DUPLEX    There are no Patient Instructions on file for this visit.   --Continue cardiac medications as reconciled in final medication list. --Return in about 6 weeks (around 06/19/2023) for Follow up, BP. Or sooner if needed. --Continue follow-up with your primary care physician regarding the management of your other chronic comorbid conditions.  Patient's questions and concerns were addressed to her satisfaction. She voices understanding of the instructions provided during this encounter.   This note was created using a voice recognition software as a result there may be grammatical errors inadvertently enclosed that do not reflect the nature of this encounter. Every attempt is made to correct such errors.  Tessa Lerner, [place], [facility]  Pager:  [phone] Office: [phone]

## 2023-05-18 ENCOUNTER — Other Ambulatory Visit: Payer: Self-pay | Admitting: Cardiology

## 2023-05-19 LAB — BASIC METABOLIC PANEL
BUN/Creatinine Ratio: 15 (ref 12–28)
BUN: 10 mg/dL (ref 8–27)
CO2: 24 mmol/L (ref 20–29)
Calcium: 9.9 mg/dL (ref 8.7–10.3)
Chloride: 105 mmol/L (ref 96–106)
Creatinine, Ser: 0.65 mg/dL (ref 0.57–1.00)
Glucose: 99 mg/dL (ref 70–99)
Potassium: 4.3 mmol/L (ref 3.5–5.2)
Sodium: 143 mmol/L (ref 134–144)
eGFR: 96 mL/min/{1.73_m2} (ref >59)

## 2023-05-23 ENCOUNTER — Ambulatory Visit (HOSPITAL_COMMUNITY): Payer: BC Managed Care – PPO | Attending: Cardiology

## 2023-05-24 ENCOUNTER — Ambulatory Visit (HOSPITAL_COMMUNITY)
Admission: RE | Admit: 2023-05-24 | Discharge: 2023-05-24 | Disposition: A | Discharge status: Home / Self Care | Payer: BC Managed Care – PPO | Source: Ambulatory Visit | Attending: Cardiology | Admitting: Cardiology

## 2023-05-24 DIAGNOSIS — I10 Essential (primary) hypertension: Secondary | ICD-10-CM | POA: Insufficient documentation

## 2023-06-19 ENCOUNTER — Encounter: Payer: Self-pay | Admitting: Cardiology

## 2023-06-19 ENCOUNTER — Ambulatory Visit: Payer: BC Managed Care – PPO | Attending: Cardiology | Admitting: Cardiology

## 2023-06-19 VITALS — BP 162/84 | HR 90 | Resp 16 | Ht 64.0 in | Wt 140.0 lb

## 2023-06-19 DIAGNOSIS — R0609 Other forms of dyspnea: Secondary | ICD-10-CM

## 2023-06-19 DIAGNOSIS — I10 Essential (primary) hypertension: Secondary | ICD-10-CM

## 2023-06-19 DIAGNOSIS — I517 Cardiomegaly: Secondary | ICD-10-CM

## 2023-06-19 MED ORDER — TORSEMIDE 10 MG PO TABS
10.0000 mg | ORAL_TABLET | ORAL | 0 refills | Status: DC
Start: 2023-06-19 — End: 2023-10-18

## 2023-06-19 NOTE — Progress Notes (Signed)
Cardiology Office Note:  .   Date:  06/19/2023  ID:  Remi Haggard, DOB 05/15/1956, MRN [id number] PCP:  Aviva Kluver  [facility] Providers Cardiologist:  Tessa Lerner, DO , [facility] (established care 04/01/2021) Electrophysiologist:  None  Click to update primary MD,subspecialty MD or APP then REFRESH:1}    History of Present Illness: .   Monique Kirby is a 67 y.o. African-American female whose past medical history and cardiovascular risk factors includes: Hypertension, asthma.  Patient was referred to the practice for evaluation of dyspnea on exertion.  His father dyspnea on exertion was likely secondary to uncontrolled hypertension.  She underwent echocardiogram which noted hyperdynamic LVEF along with LVH raising suspicion for possible infiltrative cardiomyopathy/HCM.  She was scheduled to have a cardiac MRI in the past but this is still pending-tentatively scheduled for October 2024.  With regards to blood pressure management patient has been intolerant to multiple medications please see assessment and plan and allergy list for additional details.  At the last office visit patient was started on torsemide which has helped her lower extremity swelling significantly and her renal function remains stable.  She is doing well on clonidine for now.  Renal duplex is negative for renal artery stenosis.  And has an appointment with hypertension clinic in the coming weeks.  Patient states that her home blood pressures are usually around 130 mmHg.  She has also implemented lifestyle changes and reduce salt in her diet.   Review of Systems: .   Review of Systems  Cardiovascular:  Negative for chest pain, claudication, irregular heartbeat, leg swelling, near-syncope, orthopnea, palpitations, paroxysmal nocturnal dyspnea and syncope.  Respiratory:  Positive for shortness of breath (better - asthma).   Hematologic/Lymphatic: Negative for bleeding problem.  Musculoskeletal:  Negative for  muscle cramps and myalgias.  Neurological:  Negative for dizziness and light-headedness.    Studies Reviewed:   EKG: 05/08/2023: Sinus tachycardia, 106 bpm, LAE, nonspecific ST-T changes.   Echocardiogram: 04/06/2021: Hyperdynamic LV systolic function with visual EF >70%. Left ventricle size is decreased. Moderate to severe left ventricular hypertrophy. Normal global wall motion. Normal diastolic filling pattern, normal LAP. Mild tricuspid regurgitation. No evidence of pulmonary hypertension. No prior study for comparison.   Stress Testing: Exercise Sestamibi stress test 06/06/2021: 1 Day Rest/Stress Protocol. Exercise time 2 minutes 9 seconds on Bruce protocol, achieved 4.6 METS, 106% of APMHR. Stress ECG nondiagnostic for ischemia due to baseline ST-T segments.  Normal myocardial perfusion without convincing evidence of reversible myocardial ischemia or prior infarct. LVEF gated SPECT 67% without regional wall motion abnormalities. Low risk study.  Cardiac MRI:  Pending 06/29/2023.   Vascular imaging: Renal Duplex:  05/2023 Renal:  Right: Normal size right kidney. Normal right Resisitive Index. No         evidence of right renal artery stenosis. RRV flow present.  Left:  Normal size of left kidney. Normal left Resistive Index. No         evidence of left renal artery stenosis. LRV flow present.  Mesenteric:  Normal Celiac artery and Superior Mesenteric artery findings.   Risk Assessment/Calculations:   N/A    Labs:       Latest Ref Rng & Units 09/15/2021    3:47 PM 09/25/2011   11:50 AM 02/17/2011    4:31 AM  CBC  WBC 3.4 - 10.8 x10E3/uL 8.2  7.6  8.3   Hemoglobin 11.1 - 15.9 g/dL 19.1  47.8  29.5   Hematocrit 34.0 - 46.6 %  37.4  40.2  34.1   Platelets 150 - 450 x10E3/uL 364  342  301        Latest Ref Rng & Units 05/18/2023    8:15 AM 12/27/2022    8:39 AM 09/15/2021    3:47 PM  BMP  Glucose 70 - 99 mg/dL 99  161  096   BUN 8 - 27 mg/dL 10  13  13     Creatinine 0.57 - 1.00 mg/dL 0.45  4.09  8.11   BUN/Creat Ratio 12 - 28 15  23  19    Sodium 134 - 144 mmol/L 143  137  140   Potassium 3.5 - 5.2 mmol/L 4.3  3.5  3.5   Chloride 96 - 106 mmol/L 105  95  97   CO2 20 - 29 mmol/L 24  27  27    Calcium 8.7 - 10.3 mg/dL 9.9  91.4  78.2       Latest Ref Rng & Units 05/18/2023    8:15 AM 12/27/2022    8:39 AM 09/15/2021    3:47 PM  CMP  Glucose 70 - 99 mg/dL 99  956  213   BUN 8 - 27 mg/dL 10  13  13    Creatinine 0.57 - 1.00 mg/dL 0.86  5.78  4.69   Sodium 134 - 144 mmol/L 143  137  140   Potassium 3.5 - 5.2 mmol/L 4.3  3.5  3.5   Chloride 96 - 106 mmol/L 105  95  97   CO2 20 - 29 mmol/L 24  27  27    Calcium 8.7 - 10.3 mg/dL 9.9  62.9  52.8     No results found for: "CHOL", "HDL", "LDLCALC", "LDLDIRECT", "TRIG", "CHOLHDL" No results for input(s): "LIPOA" in the last 8760 hours. No components found for: "NTPROBNP" No results for input(s): "PROBNP" in the last 8760 hours. No results for input(s): "TSH" in the last 8760 hours.  Physical Exam:    Today's Vitals   06/19/23 1543  BP: (!) 162/84  Pulse: 90  Resp: 16  SpO2: 99%  Weight: 140 lb (63.5 kg)  Height: 5\' 4"  (1.626 m)   Body mass index is 24.03 kg/m. Wt Readings from Last 3 Encounters:  06/19/23 140 lb (63.5 kg)  05/08/23 139 lb (63 kg)  12/21/22 139 lb 12.8 oz (63.4 kg)    Physical Exam  Constitutional: No distress.  Age appropriate, hemodynamically stable.   Neck: No JVD present.  Cardiovascular: Normal rate, regular rhythm, S1 normal, S2 normal, intact distal pulses and normal pulses. Exam reveals no gallop, no S3 and no S4.  No murmur heard. Pulmonary/Chest: Effort normal and breath sounds normal. No stridor. She has no wheezes. She has no rales.  Abdominal: Soft. Bowel sounds are normal. She exhibits no distension. There is no abdominal tenderness.  Musculoskeletal:        General: Edema (+1 bilaterally) present.     Cervical back: Neck supple.  Neurological:  She is alert and oriented to person, place, and time. She has intact cranial nerves (2-12).  Skin: Skin is warm and moist.     Impression & Recommendation(s):  Impression:   ICD-10-CM   1. Benign hypertension  I10 torsemide (DEMADEX) 10 MG tablet    Ambulatory referral to Internal Medicine    2. Dyspnea on exertion  R06.09     3. Left ventricular hypertrophy  I51.7        Recommendation(s):  Benign hypertension Office blood pressure not at  goal. However, on current medical therapy patient states that home blood pressures are at goal-SBP around 130 mmHg on average. Has been intolerant to multiple antihypertensive medications in the past which includes but not limited to chlorthalidone, metoprolol, BiDil. Renal duplex negative for renal artery stenosis (performed since last office visit). Currently on clonidine 0.1 mg p.o. twice daily. Patient has appointment with Dr. Duke Salvia on 07/11/2023 at hypertension clinic.  Dyspnea on exertion Significantly improved with better blood pressure management at home. Monitor for now.  Left ventricular hypertrophy Cardiac MRI scheduled later this month in October 2024. Further recommendations to follow.  Patient currently does not have a primary care provider.  Will refer her for longitudinal care.  Orders Placed:  Orders Placed This Encounter  Procedures   Ambulatory referral to Internal Medicine    Referral Priority:   Routine    Referral Type:   Consultation    Referral Reason:   Specialty Services Required    Requested Specialty:   Internal Medicine    Number of Visits Requested:   1    As part of medical decision making results of the echo, renal duplex were reviewed independently at today's visit.   Final Medication List:    Meds ordered this encounter  Medications   torsemide (DEMADEX) 10 MG tablet    Sig: Take 1 tablet (10 mg total) by mouth every morning.    Dispense:  90 tablet    Refill:  0    Medications  Discontinued During This Encounter  Medication Reason   torsemide (DEMADEX) 10 MG tablet Reorder     Current Outpatient Medications:    albuterol (VENTOLIN HFA) 108 (90 Base) MCG/ACT inhaler, Inhale 1-2 puffs into the lungs every 4 (four) hours as needed., Disp: , Rfl:    cetirizine (ZYRTEC) 10 MG tablet, Take 10 mg by mouth daily as needed for allergies., Disp: , Rfl:    cloNIDine (CATAPRES) 0.1 MG tablet, Take 1 tablet (0.1 mg total) by mouth 2 (two) times daily., Disp: 180 tablet, Rfl: 0   diltiazem (CARDIZEM CD) 120 MG 24 hr capsule, Take 1 capsule (120 mg total) by mouth every morning., Disp: 90 capsule, Rfl: 3   fexofenadine (ALLEGRA) 180 MG tablet, Take 180 mg by mouth daily as needed., Disp: , Rfl:    fluticasone (FLONASE) 50 MCG/ACT nasal spray, Place into both nostrils daily., Disp: , Rfl:    fluticasone (FLOVENT HFA) 110 MCG/ACT inhaler, Inhale into the lungs 2 (two) times daily., Disp: , Rfl:    hydrOXYzine (ATARAX/VISTARIL) 25 MG tablet, Take 25 mg by mouth every 8 (eight) hours., Disp: , Rfl:    latanoprost (XALATAN) 0.005 % ophthalmic solution, Place 1 drop into both eyes daily at 12 noon., Disp: , Rfl:    valsartan (DIOVAN) 320 MG tablet, TAKE 1 TABLET BY MOUTH EVERY DAY IN THE EVENING, Disp: 90 tablet, Rfl: 1   torsemide (DEMADEX) 10 MG tablet, Take 1 tablet (10 mg total) by mouth every morning., Disp: 90 tablet, Rfl: 0  Consent:      NA     Disposition:   Return in about 6 months (around 12/18/2023) for Follow up, BP. or sooner if needed.  Her questions and concerns were addressed to her satisfaction. She voices understanding of the recommendations provided during this encounter.    Signed, Tessa Lerner, DO, [facility]  [facility]  [address] [place], [place] [address] [phone] 06/19/2023 5:35 PM

## 2023-06-19 NOTE — Patient Instructions (Addendum)
Medication Instructions:  Your physician recommends that you continue on your current medications as directed. Please refer to the Current Medication list given to you today.  *If you need a refill on your cardiac medications before your next appointment, please call your pharmacy*   Lab Work: None.  If you have labs (blood work) drawn today and your tests are completely normal, you will receive your results only by: MyChart Message (if you have MyChart) OR A paper copy in the mail If you have any lab test that is abnormal or we need to change your treatment, we will call you to review the results.   Testing/Procedures: None.   Follow-Up: Your next appointment:   6 month(s)  Provider:   Tessa Lerner, DO     Other Instructions Please contact [facility] to establish with a Primary Care Provider:  [address] [place], [place] [address] Phone: [phone]

## 2023-06-26 ENCOUNTER — Encounter (HOSPITAL_COMMUNITY): Payer: Self-pay

## 2023-06-27 ENCOUNTER — Telehealth (HOSPITAL_COMMUNITY): Payer: Self-pay | Admitting: *Deleted

## 2023-06-27 NOTE — Telephone Encounter (Signed)
Attempted to call patient regarding upcoming cardiac MRI appointment. Left message on voicemail with name and callback number  Hae Ahlers RN Navigator Cardiac Imaging [facility] [phone] Office [phone] Cell  

## 2023-06-29 ENCOUNTER — Other Ambulatory Visit: Payer: Self-pay | Admitting: Cardiology

## 2023-06-29 ENCOUNTER — Ambulatory Visit (HOSPITAL_COMMUNITY)
Admission: RE | Admit: 2023-06-29 | Discharge: 2023-06-29 | Disposition: A | Discharge status: Home / Self Care | Payer: BC Managed Care – PPO | Source: Ambulatory Visit | Attending: Cardiology | Admitting: Cardiology

## 2023-06-29 DIAGNOSIS — I517 Cardiomegaly: Secondary | ICD-10-CM | POA: Diagnosis present

## 2023-06-29 DIAGNOSIS — I10 Essential (primary) hypertension: Secondary | ICD-10-CM

## 2023-06-29 MED ORDER — GADOBUTROL 1 MMOL/ML IV SOLN
6.0000 mL | Freq: Once | INTRAVENOUS | Status: AC | PRN
  Administered 2023-06-29: 6 mL via INTRAVENOUS

## 2023-06-30 ENCOUNTER — Other Ambulatory Visit: Payer: Self-pay | Admitting: Cardiology

## 2023-06-30 DIAGNOSIS — I10 Essential (primary) hypertension: Secondary | ICD-10-CM

## 2023-07-10 ENCOUNTER — Other Ambulatory Visit: Payer: Self-pay | Admitting: Cardiology

## 2023-07-10 DIAGNOSIS — I10 Essential (primary) hypertension: Secondary | ICD-10-CM

## 2023-07-11 ENCOUNTER — Encounter (HOSPITAL_BASED_OUTPATIENT_CLINIC_OR_DEPARTMENT_OTHER): Payer: Self-pay | Admitting: Cardiovascular Disease

## 2023-07-11 ENCOUNTER — Ambulatory Visit (HOSPITAL_BASED_OUTPATIENT_CLINIC_OR_DEPARTMENT_OTHER): Payer: BC Managed Care – PPO | Admitting: Cardiovascular Disease

## 2023-07-11 VITALS — BP 142/64 | HR 101 | Ht 64.0 in | Wt 142.1 lb

## 2023-07-11 DIAGNOSIS — Z13220 Encounter for screening for lipoid disorders: Secondary | ICD-10-CM | POA: Diagnosis not present

## 2023-07-11 DIAGNOSIS — R400 Somnolence: Secondary | ICD-10-CM | POA: Diagnosis not present

## 2023-07-11 DIAGNOSIS — I10 Essential (primary) hypertension: Secondary | ICD-10-CM | POA: Insufficient documentation

## 2023-07-11 DIAGNOSIS — I517 Cardiomegaly: Secondary | ICD-10-CM | POA: Insufficient documentation

## 2023-07-11 DIAGNOSIS — R0683 Snoring: Secondary | ICD-10-CM

## 2023-07-11 DIAGNOSIS — J45909 Unspecified asthma, uncomplicated: Secondary | ICD-10-CM | POA: Insufficient documentation

## 2023-07-11 MED ORDER — SPIRONOLACTONE 25 MG PO TABS: 25.0000 mg | ORAL_TABLET | Freq: Every day | ORAL | 3 refills | Status: DC

## 2023-07-11 NOTE — Progress Notes (Deleted)
Advanced Hypertension Clinic Initial Assessment:    Date:  07/11/2023   ID:  Monique Kirby, DOB March 04, 1956, MRN [id number]  PCP:  Pcp, No  Cardiologist:  Tessa Lerner, DO  Nephrologist:  Referring MD: Tessa Lerner, DO   CC: Hypertension  History of Present Illness:    Monique Kirby is a 67 y.o. female with a hx of hypertension and asthma here to establish care in the Advanced Hypertension Clinic.  She first saw Dr. Odis Hollingshead for evaluation of dyspnea.  She had an echo 03/2021 that revealed LVEF greater than 70%.  She had moderate to severe LVH.  Nuclear stress test was negative for ischemia.  Cardiac MRI 06/2023 revealed moderate LVH with a septal thickness of 1.5 cm.  There is no evidence of hypertrophic cardiomyopathy.  There was a small area of basal anterior gadolinium uptake that was thought to be due to either hypertensive   disease or sarcoidosis.  At her last clinic visit 06/2023 blood pressures remained uncontrolled and she continued to struggle with multiple medication intolerances.  Prior evaluation revealed normal renal arteries by renal artery Doppler and she was referred to advanced hypertension clinic.  She did report that her home blood pressures were averaging in the 130s and that she was working on lifestyle changes.  Previous antihypertensives: Chlorthalidone Metoprolol BiDil Hydrochlorothiazide Olmesartan Valsartan Valsartan/HCTZ  Past Medical History:  Diagnosis Date   Asthma    Headache(784.0)    migranes   Hypertension    Rash Tuesday pm 09/26/11   left upper leg ...     Past Surgical History:  Procedure Laterality Date   COLONOSCOPY     DILATION AND CURETTAGE OF UTERUS     DISTAL ULNA EXCISION     ENDOMETRIAL ABLATION     HEEL SPUR SURGERY     JOINT REPLACEMENT  02/14/2011   knee rt   KNEE ARTHROSCOPY  2012   LUMBAR LAMINECTOMY/DECOMPRESSION MICRODISCECTOMY  09/28/2011   Procedure: LUMBAR LAMINECTOMY/DECOMPRESSION MICRODISCECTOMY;  Surgeon:  Alvy Beal;  Location: [facility] OR;  Service: Orthopedics;  Laterality: Right;  RIGHT L3-4 DISCECTOMY   WRIST SURGERY      Current Medications: No outpatient medications have been marked as taking for the 07/11/23 encounter (Appointment) with Chilton Si, MD.     Allergies:   Shrimp [shellfish allergy], Skai Lickteig Bing allergy], Metoprolol, Amoxicillin-pot clavulanate, Aspirin, Bidil [isosorb dinitrate-hydralazine], Chlorthalidone, Feldene [piroxicam], Food, Levofloxacin, Levofloxacin, Lodine [etodolac], Neo-bacit-poly-lidocaine, Sulfur, Tape, Latex, and Neosporin [neomycin-bacitracin zn-polymyx]   Social History   Socioeconomic History   Marital status: Single    Spouse name: Not on file   Number of children: Not on file   Years of education: Not on file   Highest education level: Not on file  Occupational History   Not on file  Tobacco Use   Smoking status: Never   Smokeless tobacco: Never  Vaping Use   Vaping status: Never Used  Substance and Sexual Activity   Alcohol use: No   Drug use: No   Sexual activity: Not on file  Other Topics Concern   Not on file  Social History Narrative   Not on file   Social Determinants of Health   Financial Resource Strain: Not on file  Food Insecurity: Not on file  Transportation Needs: Not on file  Physical Activity: Not on file  Stress: Not on file  Social Connections: Not on file     Family History: The patient's ***family history includes Congestive Heart Failure in her brother;  Hypertension in her brother, mother, and sister; Scleroderma in her father.  ROS:   Please see the history of present illness.    *** All other systems reviewed and are negative.  EKGs/Labs/Other Studies Reviewed:    EKG:  EKG is *** ordered today.  The ekg ordered today demonstrates ***  Recent Labs: 05/18/2023: BUN 10; Creatinine, Ser 0.65; Potassium 4.3; Sodium 143   Recent Lipid Panel No results found for: "CHOL", "TRIG", "HDL", "CHOLHDL",  "VLDL", "LDLCALC", "LDLDIRECT"  Physical Exam:   VS:  There were no vitals taken for this visit. , BMI There is no height or weight on file to calculate BMI. GENERAL:  Well appearing HEENT: Pupils equal round and reactive, fundi not visualized, oral mucosa unremarkable NECK:  No jugular venous distention, waveform within normal limits, carotid upstroke brisk and symmetric, no bruits, no thyromegaly LYMPHATICS:  No cervical adenopathy LUNGS:  Clear to auscultation bilaterally HEART:  RRR.  PMI not displaced or sustained,S1 and S2 within normal limits, no S3, no S4, no clicks, no rubs, *** murmurs ABD:  Flat, positive bowel sounds normal in frequency in pitch, no bruits, no rebound, no guarding, no midline pulsatile mass, no hepatomegaly, no splenomegaly EXT:  2 plus pulses throughout, no edema, no cyanosis no clubbing SKIN:  No rashes no nodules NEURO:  Cranial nerves II through XII grossly intact, motor grossly intact throughout PSYCH:  Cognitively intact, oriented to person place and time   ASSESSMENT/PLAN:    No problem-specific Assessment & Plan notes found for this encounter.   Screening for Secondary Hypertension: { Click here to document screening for secondary causes of HTN  :[id number]}    Relevant Labs/Studies:    Latest Ref Rng & Units 05/18/2023    8:15 AM 12/27/2022    8:39 AM 09/15/2021    3:47 PM  Basic Labs  Sodium 134 - 144 mmol/L 143  137  140   Potassium 3.5 - 5.2 mmol/L 4.3  3.5  3.5   Creatinine 0.57 - 1.00 mg/dL 4.09  8.11  9.14        Latest Ref Rng & Units 02/16/2011    6:24 PM  Thyroid   TSH 0.350 - 4.500 uIU/mL 2.080                 05/24/2023    8:18 AM  Renovascular   Renal Artery [place] Completed Yes     Disposition:    FU with MD/PharmD in {gen number [id number]} {Days to years:10300}    Medication Adjustments/Labs and Tests Ordered: Current medicines are reviewed at length with the patient today.  Concerns regarding medicines are outlined  above.  No orders of the defined types were placed in this encounter.  No orders of the defined types were placed in this encounter.    Signed, Chilton Si, MD  07/11/2023 8:52 AM    [facility]

## 2023-07-11 NOTE — Progress Notes (Signed)
Advanced Hypertension Clinic Initial Assessment:    Date:  07/11/2023   ID:  ANNASTACIA Kirby, DOB 1956-01-19, MRN [id number]  PCP:  Pcp, No  Cardiologist:  Tessa Lerner, DO  Nephrologist:  Referring MD: Tessa Lerner, DO   CC: Hypertension  History of Present Illness:    Monique Kirby is a 67 y.o. female with a hx of hypertension and asthma here to establish care in the Advanced Hypertension Clinic.  She first saw Dr. Odis Hollingshead for evaluation of dyspnea.  She had an echo 03/2021 that revealed LVEF greater than 70%.  She had moderate to severe LVH.  Nuclear stress test was negative for ischemia.  Cardiac MRI 06/2023 revealed moderate LVH with a septal thickness of 1.5 cm.  There is no evidence of hypertrophic cardiomyopathy.  There was a small area of basal anterior gadolinium uptake that was thought to be due to either hypertensive heart disease or sarcoidosis.  At her last clinic visit 06/2023 blood pressures remained uncontrolled and she continued to struggle with multiple medication intolerances.  Prior evaluation revealed normal renal arteries by renal artery Doppler and she was referred to advanced hypertension clinic.  She did report that her home blood pressures were averaging in the 130s and that she was working on lifestyle changes.   Today, she notes she has been taking antihypertensives since her 30's. It has been difficult to control only more recently, which she attributes to dietary indiscretions and blood pressure spikes secondary to more frequent allergies/asthma exacerbations. In the office her blood pressure is 156/75 initially, and 142/64 on manual recheck. She states that she has had white coat syndrome in the past. At home, she usually monitors her blood pressure once a day when she is relaxed, and sees readings such as 134/71, 144/85, and 131/75. She confirms that she was previously intolerant of chlorthalidone due to leg swelling and angioedema. She then tried Bidil but she  developed full body myalgias. At this time she notes she has a little swelling on clonidine. For exercise she often walks up and down the hall at work, or walks through the garden area at [employer]. She may walk up to 20-30 minutes on good days. She used to walk 7 days a week but has had to decrease this due to her breathing issues. She will feel congested and short winded because of her allergies and asthma. She follows with her allergist Dr. Bentley Callas. She has been trying to watch her sodium intake. She enjoys the [employer] soups, and may have sweet tea about every other day. When she does drink tea she will also drink the same amount of water. Every now and again she may have a soda. For pain management she usually takes a tylenol PM. Occasionally she has caught herself snoring when waking up. At times it takes a while for her to fall asleep as her mind is racing. She denies any palpitations, chest pain, shortness of breath, lightheadedness, headaches, syncope, orthopnea, or PND.  Previous antihypertensives: Chlorthalidone - LE edema, angioedema Bidil - myalgias Metoprolol HCTZ  Past Medical History:  Diagnosis Date   Asthma    Asthma due to environmental allergies 07/11/2023   Headache(784.0)    migranes   Hypertension    LVH (left ventricular hypertrophy) 07/11/2023   Primary hypertension 07/11/2023   Rash Tuesday pm 09/26/11   left upper leg ...     Past Surgical History:  Procedure Laterality Date   COLONOSCOPY     DILATION AND CURETTAGE  OF UTERUS     DISTAL ULNA EXCISION     ENDOMETRIAL ABLATION     HEEL SPUR SURGERY     JOINT REPLACEMENT  02/14/2011   knee rt   KNEE ARTHROSCOPY  2012   LUMBAR LAMINECTOMY/DECOMPRESSION MICRODISCECTOMY  09/28/2011   Procedure: LUMBAR LAMINECTOMY/DECOMPRESSION MICRODISCECTOMY;  Surgeon: Alvy Beal;  Location: [facility] OR;  Service: Orthopedics;  Laterality: Right;  RIGHT L3-4 DISCECTOMY   WRIST SURGERY      Current Medications: Current Meds  Medication  Sig   albuterol (VENTOLIN HFA) 108 (90 Base) MCG/ACT inhaler Inhale 1-2 puffs into the lungs every 4 (four) hours as needed.   cetirizine (ZYRTEC) 10 MG tablet Take 10 mg by mouth daily as needed for allergies.   cloNIDine (CATAPRES) 0.1 MG tablet TAKE 1 TABLET BY MOUTH 2 TIMES DAILY.   diltiazem (CARDIZEM CD) 120 MG 24 hr capsule Take 1 capsule (120 mg total) by mouth every morning.   fexofenadine (ALLEGRA) 180 MG tablet Take 180 mg by mouth daily as needed.   fluticasone (FLONASE) 50 MCG/ACT nasal spray Place into both nostrils daily.   fluticasone (FLOVENT HFA) 110 MCG/ACT inhaler Inhale into the lungs 2 (two) times daily.   hydrOXYzine (ATARAX/VISTARIL) 25 MG tablet Take 25 mg by mouth every 8 (eight) hours.   latanoprost (XALATAN) 0.005 % ophthalmic solution Place 1 drop into both eyes daily at 12 noon.   spironolactone (ALDACTONE) 25 MG tablet Take 1 tablet (25 mg total) by mouth daily.   torsemide (DEMADEX) 10 MG tablet Take 1 tablet (10 mg total) by mouth every morning.   valsartan (DIOVAN) 320 MG tablet TAKE 1 TABLET BY MOUTH EVERY DAY IN THE EVENING     Allergies:   Shrimp [shellfish allergy], Tuna [fish allergy], Metoprolol, Amoxicillin-pot clavulanate, Aspirin, Bidil [isosorb dinitrate-hydralazine], Chlorthalidone, Feldene [piroxicam], Food, Levofloxacin, Levofloxacin, Lodine [etodolac], Neo-bacit-poly-lidocaine, Sulfur, Tape, Latex, and Neosporin [neomycin-bacitracin zn-polymyx]   Social History   Socioeconomic History   Marital status: Single    Spouse name: Not on file   Number of children: Not on file   Years of education: Not on file   Highest education level: Not on file  Occupational History   Not on file  Tobacco Use   Smoking status: Never   Smokeless tobacco: Never  Vaping Use   Vaping status: Never Used  Substance and Sexual Activity   Alcohol use: No   Drug use: No   Sexual activity: Not on file  Other Topics Concern   Not on file  Social History  Narrative   Not on file   Social Determinants of Health   Financial Resource Strain: Low Risk  (07/11/2023)   Overall Financial Resource Strain (CARDIA)    Difficulty of Paying Living Expenses: Not hard at all  Food Insecurity: No Food Insecurity (07/11/2023)   Hunger Vital Sign    Worried About Running Out of Food in the Last Year: Never true    Ran Out of Food in the Last Year: Never true  Transportation Needs: No Transportation Needs (07/11/2023)   PRAPARE - Administrator, Civil Service (Medical): No    Lack of Transportation (Non-Medical): No  Physical Activity: Insufficiently Active (07/11/2023)   Exercise Vital Sign    Days of Exercise per Week: 2 days    Minutes of Exercise per Session: 30 min  Stress: No Stress Concern Present (07/11/2023)   [employer] of Occupational Health - Occupational Stress Questionnaire    Feeling of Stress :  Not at all  Social Connections: Moderately Integrated (07/11/2023)   Social Connection and Isolation Panel [NHANES]    Frequency of Communication with Friends and Family: More than three times a week    Frequency of Social Gatherings with Friends and Family: More than three times a week    Attends Religious Services: More than 4 times per year    Active Member of [employer] or Organizations: Yes    Attends Engineer, structural: More than 4 times per year    Marital Status: Never married     Family History: The patient's family history includes Atrial fibrillation in her mother; Congestive Heart Failure in her brother; Heart attack in her maternal uncle and maternal uncle; Heart failure in her brother; Hypertension in her brother, mother, and sister; Scleroderma in her father; Stroke in her maternal uncle.  ROS:   Please see the history of present illness.    (+) LE edema (+) Environmental allergies (+) Congestion (+) shortness of breath (+) Occasional snoring All other systems reviewed and are  negative.  EKGs/Labs/Other Studies Reviewed:    Cardiac MRI  06/29/2023: IMPRESSION: 1.  Moderate LVH septal thickness 15 mm No SAM or LVOT gradient   2.  Normal LV size and function LVEF 61%   3.  Normal RV size and function RVEF 56%   4. Parametric measures mildly abnormal T1/ECV but not consistent with amyloid   5. Small area of basal anterior septal gadolinium uptake Not consistent with amyloid but may be seen with HTN heart dx or sarcoid   6.  Mild LAE  Renal Artery Dopplers  05/24/2023: Summary:  Renal:    Right: Normal size right kidney. Normal right Resisitive Index. No         evidence of right renal artery stenosis. RRV flow present.  Left:  Normal size of left kidney. Normal left Resistive Index. No         evidence of left renal artery stenosis. LRV flow present.  Mesenteric:  Normal Celiac artery and Superior Mesenteric artery findings.   Exercise Sestamibi stress test  06/06/2021: 1 Day Rest/Stress Protocol. Exercise time 2 minutes 9 seconds on Bruce protocol, achieved 4.6 METS, 106% of APMHR. Stress ECG nondiagnostic for ischemia due to baseline ST-T segments.   Normal myocardial perfusion without convincing evidence of reversible myocardial ischemia or prior infarct. LVEF gated SPECT 67% without regional wall motion abnormalities. Low risk study.  Echocardiogram  04/06/2021:  Hyperdynamic LV systolic function with visual EF >70%. Left ventricle size  is decreased. Moderate to severe left ventricular hypertrophy. Normal  global wall motion. Normal diastolic filling pattern, normal LAP.  Mild tricuspid regurgitation. No evidence of pulmonary hypertension.  No prior study for comparison.   EKG:  EKG is personally reviewed. 07/11/2023: Not ordered.  Recent Labs: 05/18/2023: BUN 10; Creatinine, Ser 0.65; Potassium 4.3; Sodium 143   Recent Lipid Panel No results found for: "CHOL", "TRIG", "HDL", "CHOLHDL", "VLDL", "LDLCALC", "LDLDIRECT"  Physical Exam:     VS:  BP (!) 142/64 (BP Location: Right Arm, Patient Position: Sitting, Cuff Size: Normal)   Pulse (!) 101   Ht 5\' 4"  (1.626 m)   Wt 142 lb 1.6 oz (64.5 kg)   SpO2 99%   BMI 24.39 kg/m  , BMI Body mass index is 24.39 kg/m. GENERAL:  Well appearing HEENT: Pupils equal round and reactive, fundi not visualized, oral mucosa unremarkable NECK:  No jugular venous distention, waveform within normal limits, carotid upstroke brisk and symmetric,  no bruits, no thyromegaly LUNGS:  Clear to auscultation bilaterally HEART:  RRR.  PMI not displaced or sustained, S1 and S2 within normal limits, no S3, no S4, no clicks, no rubs, no murmurs ABD:  Flat, positive bowel sounds normal in frequency in pitch, no bruits, no rebound, no guarding, no midline pulsatile mass, no hepatomegaly, no splenomegaly EXT:  2 plus pulses throughout, 2+ LE edema to the upper tibia bilaterally, no cyanosis, no clubbing SKIN:  No rashes, no nodules NEURO:  Cranial nerves II through XII grossly intact, motor grossly intact throughout PSYCH:  Cognitively intact, oriented to person place and time   ASSESSMENT/PLAN:    # Resistant Hypertension Blood pressure not well controlled despite multiple medications. Patient reports inconsistent diet and exercise habits. Possible white coat syndrome as home readings are generally better than clinic readings. -Initiate Spironolactone 25mg  once daily in the morning. -Continue Torsemide due to current leg swelling. -Advise patient to wear compression socks. -Check labs today for renin and aldosterone levels. -Repeat labs in one week to check BMP -Encourage patient to resume regular exercise and maintain a low salt diet. -Schedule follow-up in one month with blood pressure readings tracked twice daily. - She declines participation in the remote patient monitoring study.  # Possible Sleep Apnea Patient reports occasional snoring and daytime sleepiness. -Order home sleep study to rule out  sleep apnea.  # Family History of Heart Disease and Stroke Patient reports multiple family members with heart disease and stroke. -Continue monitoring blood pressure closely and managing other cardiovascular risk factors. -Check fasting lipids  Work-related Stress Patient reports high stress levels at work, potentially contributing to hypertension. -Encourage stress management techniques such as meditation and therapy.        Screening for Secondary Hypertension:     07/11/2023    9:46 AM  Causes  Drugs/Herbals Screened     - Comments limiting sodium.  1 tea/day.  No EtOH.  No supplements.  No NSAIDs.  Renovascular HTN Screened  Sleep Apnea Screened     - Comments snores, daytime somnolence  Thyroid Disease Screened  Hyperaldosteronism Screened  Pheochromocytoma Screened  Cushing's Syndrome N/A  Hyperparathyroidism Screened  Coarctation of the Aorta Screened     - Comments BP symmetric  Compliance Screened    Relevant Labs/Studies:    Latest Ref Rng & Units 05/18/2023    8:15 AM 12/27/2022    8:39 AM 09/15/2021    3:47 PM  Basic Labs  Sodium 134 - 144 mmol/L 143  137  140   Potassium 3.5 - 5.2 mmol/L 4.3  3.5  3.5   Creatinine 0.57 - 1.00 mg/dL 4.09  8.11  9.14        Latest Ref Rng & Units 02/16/2011    6:24 PM  Thyroid   TSH 0.350 - 4.500 uIU/mL 2.080                 05/24/2023    8:18 AM  Renovascular   Renal Artery [place] Completed Yes     Disposition:    FU with APP/PharmD in 1 month for the next 3 months.   FU with Annajulia Lewing C. Duke Salvia, MD, [facility] in 4 months.  Medication Adjustments/Labs and Tests Ordered: Current medicines are reviewed at length with the patient today.  Concerns regarding medicines are outlined above.   Orders Placed This Encounter  Procedures   Catecholamines, fractionated, plasma   Metanephrines, plasma   TSH   Aldosterone + renin activity w/ ratio  Lipid panel   Comprehensive metabolic panel   [place] Sleep Study   Meds  ordered this encounter  Medications   spironolactone (ALDACTONE) 25 MG tablet    Sig: Take 1 tablet (25 mg total) by mouth daily.    Dispense:  90 tablet    Refill:  3   I,Mathew Stumpf,acting as a scribe for Chilton Si, MD.,have documented all relevant documentation on the behalf of Chilton Si, MD,as directed by  Chilton Si, MD while in the presence of Chilton Si, MD.  I, Trust Crago C. Duke Salvia, MD have reviewed all documentation for this visit.  The documentation of the exam, diagnosis, procedures, and orders on 07/11/2023 are all accurate and complete.   Signed, Chilton Si, MD  07/11/2023 10:45 AM    [facility]

## 2023-07-11 NOTE — Patient Instructions (Addendum)
Medication Instructions:  START SPIRONOLACTONE 25 MG DAILY   Labwork: RENIN/ALDOSTERONE/CATACHOLAMINES/METANEPHRINES/TSH TODAY   FASTING LP/CMET IN 1 WEEK   Testing/Procedures: [facility] HOME SLEEP STUDY   Follow-Up: 1 MONTH WITH CATILIN W NP OR PHARM D AT [place]   Any Other Special Instructions Will Be Listed Below (If Applicable). Recommend compression socks   MONITOR BLOOD PRESSURE TWICE A DAY, LOG IN BOOK. BRING BOOK AND MACHINE TO FOLLOW UP IN 1 MONTH  WatchPAT?  Is a FDA cleared portable home sleep study test that uses a watch and 3 points of contact to monitor 7 different channels, including your heart rate, oxygen saturations, body position, snoring, and chest motion.  The study is easy to use from the comfort of your own home and accurately detect sleep apnea.  Before bed, you attach the chest sensor, attached the sleep apnea bracelet to your nondominant hand, and attach the finger probe.  After the study, the raw data is downloaded from the watch and scored for apnea events.   For more information: [email or web address]  Patient Testing Instructions:  Do not put battery into the device until bedtime when you are ready to begin the test. Please call the support number if you need assistance after following the instructions below: 24 hour support line- [phone] or [facility] support at [phone] (option 2)  Download the IntelWatchPAT One" app through the google play store or App Store  Be sure to turn on or enable access to bluetooth in settlings on your smartphone/ device  Make sure no other bluetooth devices are on and within the vicinity of your smartphone/ device and WatchPAT watch during testing.  Make sure to leave your smart phone/ device plugged in and charging all night.  When ready for bed:  Follow the instructions step by step in the WatchPAT One App to activate the testing device. For additional instructions, including  video instruction, visit the WatchPAT One video on Youtube. You can search for WatchPat One within Youtube (video is 4 minutes and 18 seconds) or enter: https://youtube/watch?v=BCce_vbiwxE Please note: You will be prompted to enter a Pin to connect via bluetooth when starting the test. The PIN will be assigned to you when you receive the test.  The device is disposable, but it recommended that you retain the device until you receive a call letting you know the study has been received and the results have been interpreted.  We will let you know if the study did not transmit to [place] properly after the test is completed. You do not need to call us to confirm the receipt of the test.  Please complete the test within 48 hours of receiving PIN.   Frequently Asked Questions:  What is Watch Dennie Bible one?  A single use fully disposable home sleep apnea testing device and will not need to be returned after completion.  What are the requirements to use WatchPAT one?  The be able to have a successful watchpat one sleep study, you should have your Watch pat one device, your smart phone, watch pat one app, your PIN number and Internet access What type of phone do I need?  You should have a smart phone that uses Android 5.1 and above or any Iphone with IOS 10 and above How can I download the WatchPAT one app?  Based on your device type search for WatchPAT one app either in google play for android devices or APP store for Iphone's Where will I get my PIN for the study?  Your PIN will be provided by your physician's office. It is used for authentication and if you lose/forget your PIN, please reach out to your providers office.  I do not have Internet at home. Can I do WatchPAT one study?  WatchPAT One needs Internet connection throughout the night to be able to transmit the sleep data. You can use your home/local internet or your cellular's data package. However, it is always recommended to use home/local Internet. It is  estimated that between 20MB-30MB will be used with each study.However, the application will be looking for space in the phone to start the study.  What happens if I lose internet or bluetooth connection?  During the internet disconnection, your phone will not be able to transmit the sleep data. All the data, will be stored in your phone. As soon as the internet connection is back on, the phone will being sending the sleep data. During the bluetooth disconnection, WatchPAT one will not be able to to send the sleep data to your phone. Data will be kept in the [facility] one until two devices have bluetooth connection back on. As soon as the connection is back on, WatchPAT one will send the sleep data to the phone.  How long do I need to wear the WatchPAT one?  After you start the study, you should wear the device at least 6 hours.  How far should I keep my phone from the device?  During the night, your phone should be within 15 feet.  What happens if I leave the room for restroom or other reasons?  Leaving the room for any reason will not cause any problem. As soon as your get back to the room, both devices will reconnect and will continue to send the sleep data. Can I use my phone during the sleep study?  Yes, you can use your phone as usual during the study. But it is recommended to put your watchpat one on when you are ready to go to bed.  How will I get my study results?  A soon as you completed your study, your sleep data will be sent to the provider. They will then share the results with you when they are ready.

## 2023-07-16 ENCOUNTER — Other Ambulatory Visit: Payer: Self-pay | Admitting: Cardiology

## 2023-07-18 LAB — ALDOSTERONE + RENIN ACTIVITY W/ RATIO
Aldosterone: <1 ng/dL (ref 0.0–30.0)
Renin Activity, Plasma: 9.748 ng/mL/h — ABNORMAL HIGH (ref 0.167–5.380)

## 2023-07-18 LAB — CATECHOLAMINES, FRACTIONATED, PLASMA
Dopamine: <30 pg/mL (ref 0–48)
Epinephrine: 18 pg/mL (ref 0–62)
Norepinephrine: 663 pg/mL (ref 0–874)

## 2023-07-18 LAB — METANEPHRINES, PLASMA
Metanephrine, Free: <25 pg/mL (ref 0.0–88.0)
Normetanephrine, Free: 73.8 pg/mL (ref 0.0–285.2)

## 2023-07-18 LAB — TSH: TSH: 2.31 u[IU]/mL (ref 0.450–4.500)

## 2023-07-20 LAB — LIPID PANEL
Chol/HDL Ratio: 3.3 ratio (ref 0.0–4.4)
Cholesterol, Total: 180 mg/dL (ref 100–199)
HDL: 55 mg/dL (ref >39)
LDL Chol Calc (NIH): 107 mg/dL — ABNORMAL HIGH (ref 0–99)
Triglycerides: 99 mg/dL (ref 0–149)
VLDL Cholesterol Cal: 18 mg/dL (ref 5–40)

## 2023-07-20 LAB — COMPREHENSIVE METABOLIC PANEL
ALT: 16 [IU]/L (ref 0–32)
AST: 15 [IU]/L (ref 0–40)
Albumin: 4.4 g/dL (ref 3.9–4.9)
Alkaline Phosphatase: 62 [IU]/L (ref 44–121)
BUN/Creatinine Ratio: 15 (ref 12–28)
BUN: 10 mg/dL (ref 8–27)
Bilirubin Total: 0.3 mg/dL (ref 0.0–1.2)
CO2: 28 mmol/L (ref 20–29)
Calcium: 9.7 mg/dL (ref 8.7–10.3)
Chloride: 99 mmol/L (ref 96–106)
Creatinine, Ser: 0.66 mg/dL (ref 0.57–1.00)
Globulin, Total: 2.6 g/dL (ref 1.5–4.5)
Glucose: 118 mg/dL — ABNORMAL HIGH (ref 70–99)
Potassium: 3.6 mmol/L (ref 3.5–5.2)
Sodium: 141 mmol/L (ref 134–144)
Total Protein: 7 g/dL (ref 6.0–8.5)
eGFR: 95 mL/min/{1.73_m2} (ref >59)

## 2023-07-25 ENCOUNTER — Other Ambulatory Visit: Payer: Self-pay | Admitting: Cardiology

## 2023-07-25 DIAGNOSIS — I10 Essential (primary) hypertension: Secondary | ICD-10-CM

## 2023-08-01 ENCOUNTER — Telehealth: Payer: Self-pay | Admitting: Cardiology

## 2023-08-01 NOTE — Telephone Encounter (Signed)
Patient calling to see if she can get a dr note. She states that it needs to say "patient can't do right along right now until the swelling in her feet go down". Please advise

## 2023-08-01 NOTE — Telephone Encounter (Signed)
Patient only sees Dr Duke Salvia for ADV HTN, Dr Odis Hollingshead primary cardiologist  Will route to [facility] triage

## 2023-08-01 NOTE — Telephone Encounter (Signed)
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 She would like a note stating her feet are swollen and she wears crocs right and she is not able to do that. She can resume normal duties once the swelling has gone down.

## 2023-08-02 NOTE — Telephone Encounter (Signed)
Spoke with patient and she states she had an allergic reaction to Chlorthalidone. Her throat started to close up, palms and feet started to itch and her feet and legs swelled up. She states her legs and feet is still swollen. The swelling is going down but she states it takes a while that is just how her body reacts. 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

## 2023-08-02 NOTE — Telephone Encounter (Signed)
Patient stated her feet is swollen from the medication Dr. Duke Salvia put her on. She stopped medication per Dr. Leonides Sake request but the swelling has not gone down.

## 2023-08-02 NOTE — Telephone Encounter (Signed)
I agree, Dr. Duke Salvia is only seeing her for HTN.   What medication did she stopped? Is she wearing compression stocking? Has she spoke to her PCP - regarding non cardiac reasons / workup.   Vickki Igou [place], DO, [facility]

## 2023-08-03 NOTE — Telephone Encounter (Signed)
Let me know what the patient says.   ST

## 2023-08-03 NOTE — Telephone Encounter (Signed)
Dr Duke Salvia prescribed Spironolactone at her first visit with her 07/11/23 Looks like Chlorthalidone was added to her allergy list in August

## 2023-08-03 NOTE — Telephone Encounter (Signed)
Spoke with patient and she states her legs and feet are still swollen from  allergic to reaction to chlorthalidone. Advised if it was an allergic reaction from medication it should have resolved due to half life of medication. Advised to follow up with PCP for further treatment. She verbalized understanding.

## 2023-08-14 ENCOUNTER — Ambulatory Visit (HOSPITAL_BASED_OUTPATIENT_CLINIC_OR_DEPARTMENT_OTHER): Payer: BC Managed Care – PPO | Admitting: Family

## 2023-08-14 ENCOUNTER — Ambulatory Visit
Payer: BC Managed Care – PPO | Attending: Internal Medicine | Admitting: Pharmacist Clinician (PhC)/ Clinical Pharmacy Specialist

## 2023-08-14 VITALS — BP 116/65 | HR 65

## 2023-08-14 DIAGNOSIS — I10 Essential (primary) hypertension: Secondary | ICD-10-CM | POA: Diagnosis not present

## 2023-08-14 NOTE — Patient Instructions (Signed)
Follow up appointment: Tuesday March 4 at 3:30 pm  Take your BP meds as follows: continue with your current medications  Check your blood pressure at home twice daily, at least 3 days per week (twice daily) and keep record of the readings.  Hypertension "High blood pressure"  Hypertension is often called "The Silent Killer." It rarely causes symptoms until it is extremely  high or has done damage to other organs in the body. For this reason, you should have your  blood pressure checked regularly by your physician. We will check your blood pressure  every time you see a provider at one of our offices.   Your blood pressure reading consists of two numbers. Ideally, blood pressure should be  below 120/80. The first ("top") number is called the systolic pressure. It measures the  pressure in your arteries as your heart beats. The second ("bottom") number is called the diastolic pressure. It measures the pressure in your arteries as the heart relaxes between beats.  The benefits of getting your blood pressure under control are enormous. A 10-point  reduction in systolic blood pressure can reduce your risk of stroke by 27% and heart failure by 28%  Your blood pressure goal is < 130/80  To check your pressure at home you will need to:  1. Sit up in a chair, with feet flat on the floor and back supported. Do not cross your ankles or legs. 2. Rest your left arm so that the cuff is about heart level. If the cuff goes on your upper arm,  then just relax the arm on the table, arm of the chair or your lap. If you have a wrist cuff, we  suggest relaxing your wrist against your chest (think of it as Pledging the Flag with the  wrong arm).  3. Place the cuff snugly around your arm, about 1 inch above the crook of your elbow. The  cords should be inside the groove of your elbow.  4. Sit quietly, with the cuff in place, for about 5 minutes. After that 5 minutes press the power  button to start a  reading. 5. Do not talk or move while the reading is taking place.  6. Record your readings on a sheet of paper. Although most cuffs have a memory, it is often  easier to see a pattern developing when the numbers are all in front of you.  7. You can repeat the reading after 1-3 minutes if it is recommended  Make sure your bladder is empty and you have not had caffeine or tobacco within the last 30 min  Always bring your blood pressure log with you to your appointments. If you have not brought your monitor in to be double checked for accuracy, please bring it to your next appointment.  You can find a list of quality blood pressure cuffs at validatebp.org

## 2023-08-14 NOTE — Progress Notes (Unsigned)
Office Visit    Patient Name: Monique Kirby Date of Encounter: 08/15/2023  Primary Care Provider:  Melida Quitter, MD Primary Cardiologist:  Tessa Lerner, DO  Chief Complaint    Hypertension - Advanced hypertension clinic  Past Medical History   No significant cardiac history  Allergies  Allergen Reactions   Shrimp [Shellfish Allergy] Anaphylaxis   [place] Allergy] Anaphylaxis and Swelling   Metoprolol Hives   Amoxicillin-Pot Clavulanate    Aspirin Other (See Comments)    Makes pt sick   Bidil [Isosorb Dinitrate-Hydralazine]     Entire body hurting   Chlorthalidone Other (See Comments)    Hives, itching, throat closing requiring Benadryl use   Feldene [Piroxicam] Swelling   Food Other (See Comments)    Zucchini- mouth swells   Levofloxacin Hives   Levofloxacin    Lodine [Etodolac] Hives   Neo-Bacit-Poly-Lidocaine    Sulfur     Other reaction(s): Unknown   Tape    Latex Itching and Rash   Neosporin [Neomycin-Bacitracin Zn-Polymyx] Rash    History of Present Illness    Monique Kirby is a 67 y.o. female patient who was referred to the Advanced Hypertension Clinic by Dr. Tessa Lerner for hard to control hypertension.  She saw Dr. Duke Salvia on 07/11/23. Her blood pressure in office was 142/64. She has been taking antihypertensives since her 30's. It had been difficult to control more recently, which she attributes to dietary indiscretions and blood pressure spikes secondary to more frequent allergies/asthma exacerbations. She mentioned that her home BP averaged in the 130s and that she was working on lifestyle changes. She also mentioned that she has had white coat hypertension in the past. During her visit, we added spironolactone 25 mg to her BP regimen. We also advised the patient to wear compression socks and to track her BP twice daily.  Today the patient is seen for hypertension follow-up. Overall, she is doing okay. She has been adhering to her medications.  She mentions that clonidine makes her sleepy, however, this does not interfere with daily life. She has experienced some nausea after eating, likely due to the diltiazem. She checks her blood pressure in the evenings. Her home systolic BP readings have been 134-144, and 152 a few times. She mentioned that her BP machine is 43-79 years old and she uses it on her wrist. The swelling in her feet has been improving. For exercise, sometimes she will take walks around [employer] to view the plants. Lately, she has been cooking at home. She enjoys [place] burgers, salmon, trout, sweet potatoes, and salads. She takes daily vitamin C 1000 mg and hair, skin, and nails supplement. She takes hydroxyzine as needed. She has not had any dizziness or shortness of breath.  Blood Pressure Goal:  130/80  Current Medications: clonidine 0.1 mg bid, diltiazem 120 mg every day, spironolactone 25 mg every day, valsartan 320 mg every day (pm)   Previously tried:   chlorthalidone - LEE, angioedema  Bidil - myalgias  Metoprolol  hctz   Family Hx:   Mother (alive)- hypertension, atrial fibrillation Father (deceased)- scleroderma Sister (alive)- hypertension Brother (deceased)- heart failure, hypertension, CHF  Social Hx:      No alcohol use No drug use Does not smoke   Home BP readings:      134-144 mostly with home wrist device (did not bring to visit today), highest reading at 152   Accessory Clinical Findings    Lab Results  Component Value Date  CREATININE 0.66 07/19/2023   BUN 10 07/19/2023   NA 141 07/19/2023   K 3.6 07/19/2023   CL 99 07/19/2023   CO2 28 07/19/2023   Lab Results  Component Value Date   ALT 16 07/19/2023   AST 15 07/19/2023   ALKPHOS 62 07/19/2023   BILITOT 0.3 07/19/2023   No results found for: "HGBA1C"  Screening for Secondary Hypertension:      07/11/2023    9:46 AM  Causes  Drugs/Herbals Screened     - Comments limiting sodium.  1 tea/day.  No EtOH.  No supplements.  No  NSAIDs.  Renovascular HTN Screened  Sleep Apnea Screened     - Comments snores, daytime somnolence  Thyroid Disease Screened  Hyperaldosteronism Screened  Pheochromocytoma Screened  Cushing's Syndrome N/A  Hyperparathyroidism Screened  Coarctation of the Aorta Screened     - Comments BP symmetric  Compliance Screened    Relevant Labs/Studies:    Latest Ref Rng & Units 07/19/2023    8:04 AM 05/18/2023    8:15 AM 12/27/2022    8:39 AM  Basic Labs  Sodium 134 - 144 mmol/L 141  143  137   Potassium 3.5 - 5.2 mmol/L 3.6  4.3  3.5   Creatinine 0.57 - 1.00 mg/dL 7.84  6.96  2.95        Latest Ref Rng & Units 07/11/2023   10:38 AM 02/16/2011    6:24 PM  Thyroid   TSH 0.450 - 4.500 uIU/mL 2.310  2.080        Latest Ref Rng & Units 07/11/2023   10:38 AM  Renin/Aldosterone   Aldosterone 0.0 - 30.0 ng/dL <2.8   Aldos/Renin Ratio 0.0 - 30.0 <.1        Latest Ref Rng & Units 07/11/2023   10:38 AM  Metanephrines/Catecholamines   Epinephrine 0 - 62 pg/mL 18   Norepinephrine 0 - 874 pg/mL 663   Dopamine 0 - 48 pg/mL <30   Metanephrines 0.0 - 88.0 pg/mL <25.0   Normetanephrines  0.0 - 285.2 pg/mL 73.8           05/24/2023    8:18 AM  Renovascular   Renal Artery [place] Completed Yes      Home Medications    Current Outpatient Medications  Medication Sig Dispense Refill   Multiple Vitamins-Minerals (HAIR SKIN & NAILS) TABS Take 1 tablet by mouth daily.     albuterol (VENTOLIN HFA) 108 (90 Base) MCG/ACT inhaler Inhale 1-2 puffs into the lungs every 4 (four) hours as needed.     cetirizine (ZYRTEC) 10 MG tablet Take 10 mg by mouth daily as needed for allergies.     cloNIDine (CATAPRES) 0.1 MG tablet TAKE 1 TABLET BY MOUTH 2 TIMES DAILY. 180 tablet 3   diltiazem (CARDIZEM CD) 120 MG 24 hr capsule TAKE 1 CAPSULE (120 MG TOTAL) BY MOUTH EVERY MORNING 90 capsule 3   fexofenadine (ALLEGRA) 180 MG tablet Take 180 mg by mouth daily as needed.     fluticasone (FLONASE) 50 MCG/ACT nasal  spray Place into both nostrils daily.     fluticasone (FLOVENT HFA) 110 MCG/ACT inhaler Inhale into the lungs 2 (two) times daily.     hydrOXYzine (ATARAX/VISTARIL) 25 MG tablet Take 25 mg by mouth every 8 (eight) hours.     latanoprost (XALATAN) 0.005 % ophthalmic solution Place 1 drop into both eyes daily at 12 noon.     spironolactone (ALDACTONE) 25 MG tablet Take 1 tablet (25 mg  total) by mouth daily. 90 tablet 3   torsemide (DEMADEX) 10 MG tablet Take 1 tablet (10 mg total) by mouth every morning. 90 tablet 0   valsartan (DIOVAN) 320 MG tablet TAKE 1 TABLET BY MOUTH EVERY DAY IN THE EVENING 90 tablet 1   No current facility-administered medications for this visit.     Assessment & Plan      Primary hypertension A: Goal <130/80. Her blood pressure today is 116/67. Currently controlled on diltiazem 120 mg, spironolactone 25 mg, clonidine 0.1 mg BID, valsartan 320 mg, and torsemide 10 mg. Currently, no changes to her BP regimen are recommended. We advised her to eat crackers when she is taking diltiazem to help offset the nausea. We instructed her to bring her BP cuff/machine with her on the next visit.   P:  Continue with all current medications. She was instructed to monitor her blood pressure at home 3 times a week, including morning and evening.  The patient will follow up with [place] on 10/23/23.     Phillips Hay PharmD CPP [facility]  [address] [place], [place] [address] [phone]

## 2023-08-15 ENCOUNTER — Encounter: Payer: Self-pay | Admitting: Pharmacist Clinician (PhC)/ Clinical Pharmacy Specialist

## 2023-08-15 NOTE — Assessment & Plan Note (Signed)
A: Goal <130/80. Her blood pressure today is 116/67. Currently controlled on diltiazem 120 mg, spironolactone 25 mg, clonidine 0.1 mg BID, valsartan 320 mg, and torsemide 10 mg. Currently, no changes to her BP regimen are recommended. We advised her to eat crackers when she is taking diltiazem to help offset the nausea. We instructed her to bring her BP cuff/machine with her on the next visit.   P:  Continue with all current medications. She was instructed to monitor her blood pressure at home 3 times a week, including morning and evening.  The patient will follow up with [place] on 10/23/23.

## 2023-09-17 ENCOUNTER — Telehealth: Payer: Self-pay | Admitting: Cardiology

## 2023-09-17 DIAGNOSIS — I10 Essential (primary) hypertension: Secondary | ICD-10-CM

## 2023-09-17 MED ORDER — CLONIDINE HCL 0.1 MG PO TABS: 0.1000 mg | ORAL_TABLET | Freq: Two times a day (BID) | ORAL | 2 refills | Status: DC

## 2023-09-17 NOTE — Telephone Encounter (Signed)
Pt's medication was sent to pt's pharmacy as requested. Confirmation received.  °

## 2023-09-17 NOTE — Telephone Encounter (Signed)
*  STAT* If patient is at the pharmacy, call can be transferred to refill team.   1. Which medications need to be refilled? (please list name of each medication and dose if known) cloNIDine (CATAPRES) 0.1 MG tablet    4. Which pharmacy/location (including street and city if local pharmacy) is medication to be sent to? [id number] - [place], [place] - [address]     5. Do they need a 30 day or 90 day supply? 90

## 2023-10-18 ENCOUNTER — Other Ambulatory Visit: Payer: Self-pay | Admitting: Cardiology

## 2023-10-18 DIAGNOSIS — I10 Essential (primary) hypertension: Secondary | ICD-10-CM

## 2023-10-24 ENCOUNTER — Telehealth (HOSPITAL_BASED_OUTPATIENT_CLINIC_OR_DEPARTMENT_OTHER): Payer: Self-pay | Admitting: *Deleted

## 2023-10-24 DIAGNOSIS — R400 Somnolence: Secondary | ICD-10-CM

## 2023-10-24 DIAGNOSIS — R0683 Snoring: Secondary | ICD-10-CM

## 2023-10-24 NOTE — Telephone Encounter (Signed)
 Patient had [facility] home sleep study ordered 10/23, no activation code given Will forward to sleep team for follow up

## 2023-10-26 NOTE — Telephone Encounter (Signed)
**Note De-Identified Monique Kirby Obfuscation** Ordering provider: Dr Raford Associated diagnoses: Snoring-R06.83 and Daytime Sleepiness-G47.10 WatchPAT PA obtained on 10/26/2023 by Monique Kirby, Monique HERO, Monique Kirby. Authorization: Per Monique Kirby at Monique Kirby, a PA is not required for CPT Code: 04199 (It[facility]-HST). Patient notified of PIN ([id number]) on 10/26/2023 Monique Kirby Notification Method: phone.  Phone note routed to covering staff for follow-up.

## 2023-11-09 ENCOUNTER — Encounter (INDEPENDENT_AMBULATORY_CARE_PROVIDER_SITE_OTHER): Payer: Self-pay | Admitting: Cardiology

## 2023-11-09 DIAGNOSIS — G4719 Other hypersomnia: Secondary | ICD-10-CM

## 2023-11-09 DIAGNOSIS — R0683 Snoring: Secondary | ICD-10-CM

## 2023-11-12 ENCOUNTER — Ambulatory Visit: Payer: 59 | Attending: Cardiovascular Disease

## 2023-11-12 DIAGNOSIS — R400 Somnolence: Secondary | ICD-10-CM

## 2023-11-12 DIAGNOSIS — R0683 Snoring: Secondary | ICD-10-CM

## 2023-11-12 NOTE — Procedures (Signed)
   SLEEP STUDY REPORT Patient Information Study Date: 11/09/2023 Patient Name: Monique Kirby Patient ID: [id number] Birth Date: 25-Dec-1955 Age: 68 Gender: Female BMI: 24.1 (W=141 lb, H=5' 4'') Referring Physician: Chilton Si, MD  TEST DESCRIPTION: Home sleep apnea testing was completed using the WatchPat, a Type 1 device, utilizing peripheral arterial tonometry (PAT), chest movement, actigraphy, pulse oximetry, pulse rate, body position and snore. AHI was calculated with apnea and hypopnea using valid sleep time as the denominator. RDI includes apneas, hypopneas, and RERAs. The data acquired and the scoring of sleep and all associated events were performed in accordance with the recommended standards and specifications as outlined in the AASM Manual for the Scoring of Sleep and Associated Events 2.2.0 (2015).  FINDINGS: 1. No evidence of Obstructive Sleep Apnea with AHI 2.4/hr. 2. No Central Sleep Apnea. 3. Oxygen desaturations as low as 87%. 4. Mild to moderate snoring was present. O2 sats were < 88% for 0 minutes. 5. Total sleep time was 4 hrs and 35 min. 6. 9.4% of total sleep time was spent in REM sleep. 7. Normal sleep onset latency at 21 min. 8. Prolonged REM sleep onset latency at 264 min. 9. Total awakenings were 12.  DIAGNOSIS: Normal study with no significant sleep disordered breathing.  RECOMMENDATIONS: 1. Normal study with no significant sleep disordered breathing.  2. Healthy sleep recommendations include: adequate nightly sleep (normal 7-9 hrs/night), avoidance of caffeine after noon and alcohol near bedtime, and maintaining a sleep environment that is cool, dark and quiet.  3. Weight loss for overweight patients is recommended.  4. Snoring recommendations include: weight loss where appropriate, side sleeping, and avoidance of alcohol before bed.  5. Operation of motor vehicle or dangerous equipment must be avoided when feeling drowsy, excessively  sleepy, or mentally fatigued.  6. An ENT consultation which may be useful for specific causes of and possible treatment of bothersome snoring .  7. Weight loss may be of benefit in reducing the severity of snoring.   Signature: Armanda Magic, MD; [facility]; Diplomat, American Board of Sleep Medicine Electronically Signed: 11/12/2023 4:04:57 PM

## 2023-11-13 ENCOUNTER — Telehealth: Payer: Self-pay | Admitting: *Deleted

## 2023-11-13 NOTE — Telephone Encounter (Signed)
 The patient has been notified of the result via her mychart that her sleep study showed no significant sleep apnea.

## 2023-11-13 NOTE — Telephone Encounter (Signed)
-----   Message from Armanda Magic sent at 11/12/2023  4:06 PM EST ----- Please let patient know that sleep study showed no significant sleep apnea.

## 2023-11-20 ENCOUNTER — Ambulatory Visit: Payer: 59

## 2024-01-03 ENCOUNTER — Other Ambulatory Visit: Payer: Self-pay

## 2024-01-03 DIAGNOSIS — I10 Essential (primary) hypertension: Secondary | ICD-10-CM

## 2024-01-03 MED ORDER — VALSARTAN 320 MG PO TABS: 320.0000 mg | ORAL_TABLET | Freq: Every evening | ORAL | 1 refills | Status: DC

## 2024-01-07 ENCOUNTER — Ambulatory Visit: Attending: Cardiovascular Disease | Admitting: Pharmacist Clinician (PhC)/ Clinical Pharmacy Specialist

## 2024-01-07 ENCOUNTER — Encounter: Payer: Self-pay | Admitting: Pharmacist Clinician (PhC)/ Clinical Pharmacy Specialist

## 2024-01-07 VITALS — BP 211/91 | HR 80

## 2024-01-07 DIAGNOSIS — I10 Essential (primary) hypertension: Secondary | ICD-10-CM | POA: Diagnosis not present

## 2024-01-07 MED ORDER — SPIRONOLACTONE 50 MG PO TABS: 50.0000 mg | ORAL_TABLET | Freq: Every day | ORAL | Status: DC

## 2024-01-07 NOTE — Assessment & Plan Note (Signed)
 Assessment: BP is uncontrolled in office BP 211/91 mmHg;  above the goal (<130/80). Tolerates current medications well, without any side effects Denies SOB, palpitation, chest pain, headaches,or swelling Reiterated the importance of regular exercise and low salt diet   Plan:  Increase spironolactone  to 50 mg once daily Continue taking clonidine  0.1 mg bid, diltiazem  120 mg every day, valsartan  320 mg every day (pm) Patient to keep record of BP readings with heart rate and report to us  at the next visit Patient to follow up with Maxx Pham PharmD in 6 weeks  Labs ordered today:  BMET 10 days

## 2024-01-07 NOTE — Progress Notes (Signed)
 Office Visit    Patient Name: Monique Kirby Date of Encounter: 01/07/2024  Primary Care Provider:  Azalia Leo, MD Primary Cardiologist:  Olinda Bertrand, DO  Chief Complaint    Hypertension - Advanced hypertension clinic  Past Medical History   No significant cardiac history  Allergies  Allergen Reactions   Shrimp [Shellfish Allergy] Anaphylaxis   Floria Hurst Allergy] Anaphylaxis and Swelling   Metoprolol  Hives   Amoxicillin-Pot Clavulanate    Aspirin Other (See Comments)    Makes pt sick   Bidil [Isosorb Dinitrate-Hydralazine ]     Entire body hurting   Chlorthalidone  Other (See Comments)    Hives, itching, throat closing requiring Benadryl  use   Feldene [Piroxicam] Swelling   Food Other (See Comments)    Zucchini- mouth swells   Levofloxacin Hives   Levofloxacin    Lodine [Etodolac] Hives   Neo-Bacit-Poly-Lidocaine    Sulfur     Other reaction(s): Unknown   Tape    Latex Itching and Rash   Neosporin [Neomycin-Bacitracin Zn-Polymyx] Rash    History of Present Illness    Monique Kirby is a 68 y.o. female patient who was referred to the [facility] by Dr. Sunit Tolia for hard to control hypertension.  She saw Dr. Theodis Fiscal on 07/11/23. Her blood pressure in office was 142/64. She has been taking antihypertensives since her 30's. It had been difficult to control more recently, which she attributes to dietary indiscretions and blood pressure spikes secondary to more frequent allergies/asthma exacerbations. She mentioned that her home BP averaged in the 130s and that she was working on lifestyle changes. She also mentioned that she has had white coat hypertension in the past. During her visit, we added spironolactone  25 mg to her BP regimen. We also advised the patient to wear compression socks and to track her BP twice daily.  At her last visit pressure was excellent at 116/65.  She was asked to return in 3 months.    Today the patient is seen for  hypertension follow-up.  States most home readings in the 130-140's systolic, will fax the readings in a few days when her computer is working again.  She does note that she has significant pain in her knee today (osteoarthritis), rating it as 7/10.    Blood Pressure Goal:  130/80  Current Medications: clonidine  0.1 mg bid, diltiazem  120 mg every day, spironolactone  25 mg every day, valsartan  320 mg every day (pm)  Previously tried:   chlorthalidone  - LEE, angioedema  Bidil - myalgias  Metoprolol   hctz   Family Hx:   Mother (alive)- hypertension, atrial fibrillation Father (deceased)- scleroderma Sister (alive)- hypertension Brother (deceased)- heart failure, hypertension, CHF  Social Hx:      No alcohol use No drug use Does not smoke   Home BP readings:    notes most home readings 130-140's systolic.  Diastolic mostly < 80.  She will fax home readings.  Brought home wrist device (Omron) today and it read within 10 points of the office device.     Accessory Clinical Findings    Lab Results  Component Value Date   CREATININE 0.66 07/19/2023   BUN 10 07/19/2023   NA 141 07/19/2023   K 3.6 07/19/2023   CL 99 07/19/2023   CO2 28 07/19/2023   Lab Results  Component Value Date   ALT 16 07/19/2023   AST 15 07/19/2023   ALKPHOS 62 07/19/2023   BILITOT 0.3 07/19/2023   No results  found for: "HGBA1C"  Screening for Secondary Hypertension:      07/11/2023    9:46 AM  Causes  Drugs/Herbals Screened     - Comments limiting sodium.  1 tea/day.  No EtOH.  No supplements.  No NSAIDs.  Renovascular HTN Screened  Sleep Apnea Screened     - Comments snores, daytime somnolence  Thyroid Disease Screened  Hyperaldosteronism Screened  Pheochromocytoma Screened  Cushing's Syndrome N/A  Hyperparathyroidism Screened  Coarctation of the Aorta Screened     - Comments BP symmetric  Compliance Screened    Relevant Labs/Studies:    Latest Ref Rng & Units 07/19/2023    8:04 AM  05/18/2023    8:15 AM 12/27/2022    8:39 AM  Basic Labs  Sodium 134 - 144 mmol/L 141  143  137   Potassium 3.5 - 5.2 mmol/L 3.6  4.3  3.5   Creatinine 0.57 - 1.00 mg/dL 8.65  7.84  6.96        Latest Ref Rng & Units 07/11/2023   10:38 AM 02/16/2011    6:24 PM  Thyroid   TSH 0.450 - 4.500 uIU/mL 2.310  2.080        Latest Ref Rng & Units 07/11/2023   10:38 AM  Renin/Aldosterone   Aldosterone 0.0 - 30.0 ng/dL <2.9   Aldos/Renin Ratio 0.0 - 30.0 <.1        Latest Ref Rng & Units 07/11/2023   10:38 AM  Metanephrines/Catecholamines   Epinephrine  0 - 62 pg/mL 18   Norepinephrine 0 - 874 pg/mL 663   Dopamine 0 - 48 pg/mL <30   Metanephrines 0.0 - 88.0 pg/mL <25.0   Normetanephrines  0.0 - 285.2 pg/mL 73.8           05/24/2023    8:18 AM  Renovascular   Renal Artery US  Completed Yes      Home Medications    Current Outpatient Medications  Medication Sig Dispense Refill   spironolactone  (ALDACTONE ) 50 MG tablet Take 1 tablet (50 mg total) by mouth daily.     albuterol  (VENTOLIN  HFA) 108 (90 Base) MCG/ACT inhaler Inhale 1-2 puffs into the lungs every 4 (four) hours as needed.     cetirizine (ZYRTEC) 10 MG tablet Take 10 mg by mouth daily as needed for allergies.     cloNIDine  (CATAPRES ) 0.1 MG tablet Take 1 tablet (0.1 mg total) by mouth 2 (two) times daily. 180 tablet 2   diltiazem  (CARDIZEM  CD) 120 MG 24 hr capsule TAKE 1 CAPSULE (120 MG TOTAL) BY MOUTH EVERY MORNING 90 capsule 3   fexofenadine (ALLEGRA) 180 MG tablet Take 180 mg by mouth daily as needed.     fluticasone  (FLONASE) 50 MCG/ACT nasal spray Place into both nostrils daily.     fluticasone  (FLOVENT HFA) 110 MCG/ACT inhaler Inhale into the lungs 2 (two) times daily.     hydrOXYzine (ATARAX/VISTARIL) 25 MG tablet Take 25 mg by mouth every 8 (eight) hours.     latanoprost (XALATAN) 0.005 % ophthalmic solution Place 1 drop into both eyes daily at 12 noon.     Multiple Vitamins-Minerals (HAIR SKIN & NAILS) TABS Take 1  tablet by mouth daily.     torsemide  (DEMADEX ) 10 MG tablet Take 1 tablet (10 mg total) by mouth every morning. 90 tablet 2   valsartan  (DIOVAN ) 320 MG tablet Take 1 tablet (320 mg total) by mouth every evening. 90 tablet 1   No current facility-administered medications for this visit.  Assessment & Plan   HYPERTENSION CONTROL Vitals:   01/07/24 1528 01/07/24 1531  BP: (!) 204/93 (!) 211/91    The patient's blood pressure is elevated above target today.  In order to address the patient's elevated BP: Blood pressure will be monitored at home to determine if medication changes need to be made.; The blood pressure is usually elevated in clinic.  Blood pressures monitored at home have been optimal.     Primary hypertension Assessment: BP is uncontrolled in office BP 211/91 mmHg;  above the goal (<130/80). Tolerates current medications well, without any side effects Denies SOB, palpitation, chest pain, headaches,or swelling Reiterated the importance of regular exercise and low salt diet   Plan:  Increase spironolactone  to 50 mg once daily Continue taking clonidine  0.1 mg bid, diltiazem  120 mg every day, valsartan  320 mg every day (pm) Patient to keep record of BP readings with heart rate and report to us  at the next visit Patient to follow up with Aldrin Engelhard PharmD in 6 weeks  Labs ordered today:  BMET 10 days   Donivan Furry PharmD CPP [facility]  [address] [place], [place] [address] [phone]

## 2024-01-07 NOTE — Patient Instructions (Signed)
 Follow up appointment: Monday June 2 at 3:45 pm  Go to the lab in 10-14 days to check kidney function  Take your BP meds as follows:  INCREASE SPIRONOLACTONE  TO 50 MG ONCE DAILY (2 OF THE 25 MG TABLETS)  Check your blood pressure at home several times each week and keep record of the readings. You can fax readings in to me at [phone]  Your blood pressure goal is  130/80  To check your pressure at home you will need to:  1. Sit up in a chair, with feet flat on the floor and back supported. Do not cross your ankles or legs. 2. Rest your left arm so that the cuff is about heart level. If the cuff goes on your upper arm,  then just relax the arm on the table, arm of the chair or your lap. If you have a wrist cuff, we  suggest relaxing your wrist against your chest (think of it as Pledging the Flag with the  wrong arm).  3. Place the cuff snugly around your arm, about 1 inch above the crook of your elbow. The  cords should be inside the groove of your elbow.  4. Sit quietly, with the cuff in place, for about 5 minutes. After that 5 minutes press the power  button to start a reading. 5. Do not talk or move while the reading is taking place.  6. Record your readings on a sheet of paper. Although most cuffs have a memory, it is often  easier to see a pattern developing when the numbers are all in front of you.  7. You can repeat the reading after 1-3 minutes if it is recommended  Make sure your bladder is empty and you have not had caffeine or tobacco within the last 30 min  Always bring your blood pressure log with you to your appointments. If you have not brought your monitor in to be double checked for accuracy, please bring it to your next appointment.  You can find a list of quality blood pressure cuffs at [email or web address]  Important lifestyle changes to control high blood pressure  Intervention  Effect on the BP  Lose extra pounds and watch your waistline Weight loss is one of  the most effective lifestyle changes for controlling blood pressure. If you're overweight or obese, losing even a small amount of weight can help reduce blood pressure. Blood pressure might go down by about 1 millimeter of mercury (mm Hg) with each kilogram (about 2.2 pounds) of weight lost.  Exercise regularly As a general goal, aim for at least 30 minutes of moderate physical activity every day. Regular physical activity can lower high blood pressure by about 5 to 8 mm Hg.  Eat a healthy diet Eating a diet rich in whole grains, fruits, vegetables, and low-fat dairy products and low in saturated fat and cholesterol. A healthy diet can lower high blood pressure by up to 11 mm Hg.  Reduce salt (sodium) in your diet Even a small reduction of sodium in the diet can improve heart health and reduce high blood pressure by about 5 to 6 mm Hg.  Limit alcohol One drink equals 12 ounces of beer, 5 ounces of wine, or 1.5 ounces of 80-proof liquor.  Limiting alcohol to less than one drink a day for women or two drinks a day for men can help lower blood pressure by about 4 mm Hg.   If you have any questions or concerns please use My  Chart to send questions or call the office at [phone]

## 2024-02-18 ENCOUNTER — Encounter: Payer: Self-pay | Admitting: Pharmacist Clinician (PhC)/ Clinical Pharmacy Specialist

## 2024-02-18 ENCOUNTER — Ambulatory Visit: Attending: Cardiovascular Disease | Admitting: Pharmacist Clinician (PhC)/ Clinical Pharmacy Specialist

## 2024-02-18 VITALS — BP 146/62 | HR 92

## 2024-02-18 DIAGNOSIS — I10 Essential (primary) hypertension: Secondary | ICD-10-CM

## 2024-02-18 MED ORDER — SPIRONOLACTONE 50 MG PO TABS: 50.0000 mg | ORAL_TABLET | Freq: Every day | ORAL | 3 refills | Status: AC

## 2024-02-18 NOTE — Progress Notes (Signed)
 Office Visit    Patient Name: Monique Kirby Date of Encounter: 02/18/2024  Primary Care Provider:  Azalia Leo, MD Primary Cardiologist:  Olinda Bertrand, DO  Chief Complaint    Hypertension - Advanced hypertension clinic  Past Medical History   No significant cardiac history  Allergies  Allergen Reactions   Shrimp [Shellfish Allergy] Anaphylaxis   Floria Hurst Allergy] Anaphylaxis and Swelling   Metoprolol  Hives   Amoxicillin-Pot Clavulanate    Aspirin Other (See Comments)    Makes pt sick   Bidil [Isosorb Dinitrate-Hydralazine ]     Entire body hurting   Chlorthalidone  Other (See Comments)    Hives, itching, throat closing requiring Benadryl  use   Feldene [Piroxicam] Swelling   Food Other (See Comments)    Zucchini- mouth swells   Levofloxacin Hives   Levofloxacin    Lodine [Etodolac] Hives   Neo-Bacit-Poly-Lidocaine    Sulfur     Other reaction(s): Unknown   Tape    Latex Itching and Rash   Neosporin [Neomycin-Bacitracin Zn-Polymyx] Rash    History of Present Illness    Monique Kirby is a 68 y.o. female patient who was referred to the Advanced Hypertension Clinic by Dr. Sunit Tolia for hard to control hypertension.  She saw Dr. Theodis Fiscal on 07/11/23. Her blood pressure in office was 142/64. She has been taking antihypertensives since her 30's. It had been difficult to control more recently, which she attributes to dietary indiscretions and blood pressure spikes secondary to more frequent allergies/asthma exacerbations. She mentioned that her home BP averaged in the 130s and that she was working on lifestyle changes. She also mentioned that she has had white coat hypertension in the past. During her visit, we added spironolactone  25 mg to her BP regimen. We also advised the patient to wear compression socks and to track her BP twice daily.  At her last visit pressure was excellent at 116/65.  She was asked to return in 3 months.   At a follow up pressure was again  elevated and spironolactone  increased to 50 mg daily.    Today the patient is seen for hypertension follow-up.  She has done well with the 50 mg spironolactone  dose, noting home readings almost all WNL.  Did have 1 reading at 93/55, but was heading to bed and not bothered by hypotensive symptoms  Blood Pressure Goal:  130/80  Current Medications: clonidine  0.1 mg bid, diltiazem  120 mg every day, spironolactone  25 mg bid, valsartan  320 mg every day (pm)  Previously tried:   chlorthalidone  - LEE, angioedema  Bidil - myalgias  Metoprolol   hctz   Family Hx:   Mother (alive)- hypertension, atrial fibrillation Father (deceased)- scleroderma Sister (alive)- hypertension Brother (deceased)- heart failure, hypertension, CHF  Social Hx:      No alcohol use No drug use Does not smoke   Home BP readings: .  Brought home wrist device (Omron) and it read within 10 points of the office device.    Last 14 readings:  AM average 124/74 HR 73  (on ly 1 systolic reading > 130, all diastolic WNL          PM average  116/71  HR 72 (all readings WNL)   Accessory Clinical Findings    Lab Results  Component Value Date   CREATININE 0.66 07/19/2023   BUN 10 07/19/2023   NA 141 07/19/2023   K 3.6 07/19/2023   CL 99 07/19/2023   CO2 28 07/19/2023   Lab Results  Component Value Date   ALT 16 07/19/2023   AST 15 07/19/2023   ALKPHOS 62 07/19/2023   BILITOT 0.3 07/19/2023   No results found for: "HGBA1C"  Screening for Secondary Hypertension:      07/11/2023    9:46 AM  Causes  Drugs/Herbals Screened     - Comments limiting sodium.  1 tea/day.  No EtOH.  No supplements.  No NSAIDs.  Renovascular HTN Screened  Sleep Apnea Screened     - Comments snores, daytime somnolence  Thyroid Disease Screened  Hyperaldosteronism Screened  Pheochromocytoma Screened  Cushing's Syndrome N/A  Hyperparathyroidism Screened  Coarctation of the Aorta Screened     - Comments BP symmetric  Compliance  Screened    Relevant Labs/Studies:    Latest Ref Rng & Units 07/19/2023    8:04 AM 05/18/2023    8:15 AM 12/27/2022    8:39 AM  Basic Labs  Sodium 134 - 144 mmol/L 141  143  137   Potassium 3.5 - 5.2 mmol/L 3.6  4.3  3.5   Creatinine 0.57 - 1.00 mg/dL 1.19  1.47  8.29        Latest Ref Rng & Units 07/11/2023   10:38 AM 02/16/2011    6:24 PM  Thyroid   TSH 0.450 - 4.500 uIU/mL 2.310  2.080        Latest Ref Rng & Units 07/11/2023   10:38 AM  Renin/Aldosterone   Aldosterone 0.0 - 30.0 ng/dL <5.6   Aldos/Renin Ratio 0.0 - 30.0 <.1        Latest Ref Rng & Units 07/11/2023   10:38 AM  Metanephrines/Catecholamines   Epinephrine  0 - 62 pg/mL 18   Norepinephrine 0 - 874 pg/mL 663   Dopamine 0 - 48 pg/mL <30   Metanephrines 0.0 - 88.0 pg/mL <25.0   Normetanephrines  0.0 - 285.2 pg/mL 73.8           05/24/2023    8:18 AM  Renovascular   Renal Artery US  Completed Yes      Home Medications    Current Outpatient Medications  Medication Sig Dispense Refill   cloNIDine  (CATAPRES ) 0.1 MG tablet Take 1 tablet (0.1 mg total) by mouth 2 (two) times daily. 180 tablet 2   diltiazem  (CARDIZEM  CD) 120 MG 24 hr capsule TAKE 1 CAPSULE (120 MG TOTAL) BY MOUTH EVERY MORNING 90 capsule 3   valsartan  (DIOVAN ) 320 MG tablet Take 1 tablet (320 mg total) by mouth every evening. 90 tablet 1   albuterol  (VENTOLIN  HFA) 108 (90 Base) MCG/ACT inhaler Inhale 1-2 puffs into the lungs every 4 (four) hours as needed.     cetirizine (ZYRTEC) 10 MG tablet Take 10 mg by mouth daily as needed for allergies.     fexofenadine (ALLEGRA) 180 MG tablet Take 180 mg by mouth daily as needed.     fluticasone  (FLONASE) 50 MCG/ACT nasal spray Place into both nostrils daily.     fluticasone  (FLOVENT HFA) 110 MCG/ACT inhaler Inhale into the lungs 2 (two) times daily.     hydrOXYzine (ATARAX/VISTARIL) 25 MG tablet Take 25 mg by mouth every 8 (eight) hours.     latanoprost (XALATAN) 0.005 % ophthalmic solution Place 1  drop into both eyes daily at 12 noon.     Multiple Vitamins-Minerals (HAIR SKIN & NAILS) TABS Take 1 tablet by mouth daily.     spironolactone  (ALDACTONE ) 50 MG tablet Take 1 tablet (50 mg total) by mouth daily. 90 tablet 3  torsemide  (DEMADEX ) 10 MG tablet Take 1 tablet (10 mg total) by mouth every morning. (Patient not taking: Reported on 02/18/2024) 90 tablet 2   No current facility-administered medications for this visit.     Assessment & Plan   Primary hypertension Assessment: BP is uncontrolled in office BP 146/62 mmHg;  above the goal (<130/80). All home readings in past 14 days WNL, home cuff verified for accuracy in office at first visit Tolerates current regimen well, without any side effects Denies SOB, palpitation, chest pain, headaches,or swelling Reiterated the importance of regular exercise and low salt diet   Plan:  Start taking spironolactone  50 mg once daily rather than 25 mg bid.  Continue taking clonidine  0.1 mg bid, diltiazem  120 mg every day, spironolactone  50 mg every day, valsartan  320 mg every day (pm) Patient to keep record of BP readings with heart rate and report to us  at the next visit Patient to follow up with Dr. Albert Huff in summer  Labs ordered today:  BMET today   Donivan Furry PharmD CPP [facility]  [address] [place], [place] [address] [phone]

## 2024-02-18 NOTE — Assessment & Plan Note (Signed)
 Assessment: BP is uncontrolled in office BP 146/62 mmHg;  above the goal (<130/80). All home readings in past 14 days WNL, home cuff verified for accuracy in office at first visit Tolerates current regimen well, without any side effects Denies SOB, palpitation, chest pain, headaches,or swelling Reiterated the importance of regular exercise and low salt diet   Plan:  Start taking spironolactone  50 mg once daily rather than 25 mg bid.  Continue taking clonidine  0.1 mg bid, diltiazem  120 mg every day, spironolactone  50 mg every day, valsartan  320 mg every day (pm) Patient to keep record of BP readings with heart rate and report to us  at the next visit Patient to follow up with Dr. Albert Huff in summer  Labs ordered today:  BMET today

## 2024-02-18 NOTE — Patient Instructions (Signed)
 Follow up appointment: schedule with Dr. Albert Huff for this summer  Go to the lab to check renal function and electrolytes  Take your BP meds as follows: Continue with your current medications.  Switch to the spironolactone  50 mg tablet and take 1 tablet each morning  Check your blood pressure at home daily (if able) and keep record of the readings.  Your blood pressure goal is < 130/80  To check your pressure at home you will need to:  1. Sit up in a chair, with feet flat on the floor and back supported. Do not cross your ankles or legs. 2. Rest your left arm so that the cuff is about heart level. If the cuff goes on your upper arm,  then just relax the arm on the table, arm of the chair or your lap. If you have a wrist cuff, we  suggest relaxing your wrist against your chest (think of it as Pledging the Flag with the  wrong arm).  3. Place the cuff snugly around your arm, about 1 inch above the crook of your elbow. The  cords should be inside the groove of your elbow.  4. Sit quietly, with the cuff in place, for about 5 minutes. After that 5 minutes press the power  button to start a reading. 5. Do not talk or move while the reading is taking place.  6. Record your readings on a sheet of paper. Although most cuffs have a memory, it is often  easier to see a pattern developing when the numbers are all in front of you.  7. You can repeat the reading after 1-3 minutes if it is recommended  Make sure your bladder is empty and you have not had caffeine or tobacco within the last 30 min  Always bring your blood pressure log with you to your appointments. If you have not brought your monitor in to be double checked for accuracy, please bring it to your next appointment.  You can find a list of quality blood pressure cuffs at [email or web address]  Important lifestyle changes to control high blood pressure  Intervention  Effect on the BP  Lose extra pounds and watch your waistline Weight loss is  one of the most effective lifestyle changes for controlling blood pressure. If you're overweight or obese, losing even a small amount of weight can help reduce blood pressure. Blood pressure might go down by about 1 millimeter of mercury (mm Hg) with each kilogram (about 2.2 pounds) of weight lost.  Exercise regularly As a general goal, aim for at least 30 minutes of moderate physical activity every day. Regular physical activity can lower high blood pressure by about 5 to 8 mm Hg.  Eat a healthy diet Eating a diet rich in whole grains, fruits, vegetables, and low-fat dairy products and low in saturated fat and cholesterol. A healthy diet can lower high blood pressure by up to 11 mm Hg.  Reduce salt (sodium) in your diet Even a small reduction of sodium in the diet can improve heart health and reduce high blood pressure by about 5 to 6 mm Hg.  Limit alcohol One drink equals 12 ounces of beer, 5 ounces of wine, or 1.5 ounces of 80-proof liquor.  Limiting alcohol to less than one drink a day for women or two drinks a day for men can help lower blood pressure by about 4 mm Hg.   If you have any questions or concerns please use My Chart to send questions or  call the office at [phone]

## 2024-07-02 ENCOUNTER — Other Ambulatory Visit: Payer: Self-pay | Admitting: Cardiology

## 2024-07-02 DIAGNOSIS — I10 Essential (primary) hypertension: Secondary | ICD-10-CM

## 2024-07-04 ENCOUNTER — Other Ambulatory Visit: Payer: Self-pay | Admitting: Cardiology

## 2024-07-04 DIAGNOSIS — I10 Essential (primary) hypertension: Secondary | ICD-10-CM

## 2024-07-04 MED ORDER — DILTIAZEM HCL ER COATED BEADS 120 MG PO CP24: 120.0000 mg | ORAL_CAPSULE | Freq: Every morning | ORAL | 0 refills | Status: DC

## 2024-07-04 MED ORDER — CLONIDINE HCL 0.1 MG PO TABS: 0.1000 mg | ORAL_TABLET | Freq: Two times a day (BID) | ORAL | 0 refills | Status: DC

## 2024-07-26 ENCOUNTER — Other Ambulatory Visit: Payer: Self-pay | Admitting: Cardiology

## 2024-07-26 DIAGNOSIS — I10 Essential (primary) hypertension: Secondary | ICD-10-CM

## 2024-07-30 ENCOUNTER — Other Ambulatory Visit: Payer: Self-pay | Admitting: Cardiology

## 2024-07-30 DIAGNOSIS — I10 Essential (primary) hypertension: Secondary | ICD-10-CM

## 2024-09-28 ENCOUNTER — Other Ambulatory Visit: Payer: Self-pay | Admitting: Cardiology

## 2024-09-28 DIAGNOSIS — I10 Essential (primary) hypertension: Secondary | ICD-10-CM

## 2024-10-07 ENCOUNTER — Other Ambulatory Visit: Payer: Self-pay | Admitting: Cardiology

## 2024-10-07 DIAGNOSIS — I10 Essential (primary) hypertension: Secondary | ICD-10-CM

## 2024-10-09 NOTE — Telephone Encounter (Signed)
 Please call our office to schedule an overdue appointment with Dr. Michele before anymore refills. [phone]. Thank you 3rd Last attempt

## 2024-10-23 ENCOUNTER — Telehealth: Payer: Self-pay | Admitting: Cardiology

## 2024-10-23 DIAGNOSIS — I10 Essential (primary) hypertension: Secondary | ICD-10-CM

## 2024-10-23 MED ORDER — CLONIDINE HCL 0.1 MG PO TABS: 0.1000 mg | ORAL_TABLET | Freq: Two times a day (BID) | ORAL | 0 refills | Status: AC

## 2024-10-23 NOTE — Telephone Encounter (Signed)
 Pt scheduled 12/31/24, enough sent until appointment.

## 2024-10-23 NOTE — Telephone Encounter (Signed)
" °*  STAT* If patient is at the pharmacy, call can be transferred to refill team.   1. Which medications need to be refilled? (please list name of each medication and dose if known) cloNIDine  (CATAPRES ) 0.1 MG tablet    2. Would you like to learn more about the convenience, safety, & potential cost savings by using the [facility]?    3. Are you open to using the [facility] (Type [facility].    4. Which pharmacy/location (including street and city if local pharmacy) is medication to be sent to? [id number] - [place], [place] - [address]    5. Do they need a 30 day or 90 day supply? 90  Patient is out of meds    Patient scheduled follow up with Dr Michele 12/31/24    "

## 2024-12-31 ENCOUNTER — Ambulatory Visit: Admitting: Cardiology
